# Patient Record
Sex: Male | Born: 1955 | Race: White | Hispanic: No | Marital: Married | Smoking: Former smoker
Health system: Southern US, Community
[De-identification: ages and names within clinical notes are randomized; demographics above are authoritative.]

## PROBLEM LIST (undated history)

## (undated) DIAGNOSIS — I10 Essential (primary) hypertension: Secondary | ICD-10-CM

## (undated) DIAGNOSIS — E119 Type 2 diabetes mellitus without complications: Secondary | ICD-10-CM

## (undated) HISTORY — PX: UVULOPALATOPHARYNGOPLASTY: SHX827

---

## 2010-03-18 DEATH — deceased

## 2011-09-09 ENCOUNTER — Inpatient Hospital Stay: Payer: Self-pay | Admitting: Specialist

## 2011-09-09 LAB — CBC WITH DIFFERENTIAL/PLATELET
Basophil #: 0.1 10*3/uL (ref 0.0–0.1)
Basophil %: 0.7 %
Eosinophil %: 0.1 %
HCT: 39.6 % — ABNORMAL LOW (ref 40.0–52.0)
HGB: 13.7 g/dL (ref 13.0–18.0)
Lymphocyte #: 0.8 10*3/uL — ABNORMAL LOW (ref 1.0–3.6)
MCH: 31.7 pg (ref 26.0–34.0)
MCV: 91 fL (ref 80–100)
Monocyte %: 7.4 %
Neutrophil #: 8.1 10*3/uL — ABNORMAL HIGH (ref 1.4–6.5)
RBC: 4.34 10*6/uL — ABNORMAL LOW (ref 4.40–5.90)
RDW: 13 % (ref 11.5–14.5)

## 2011-09-09 LAB — COMPREHENSIVE METABOLIC PANEL
Albumin: 3.8 g/dL (ref 3.4–5.0)
Anion Gap: 9 (ref 7–16)
BUN: 15 mg/dL (ref 7–18)
Calcium, Total: 8.5 mg/dL (ref 8.5–10.1)
Chloride: 102 mmol/L (ref 98–107)
Creatinine: 1.04 mg/dL (ref 0.60–1.30)
EGFR (African American): >60
Glucose: 173 mg/dL — ABNORMAL HIGH (ref 65–99)
Potassium: 3.7 mmol/L (ref 3.5–5.1)
SGOT(AST): 21 U/L (ref 15–37)
SGPT (ALT): 27 U/L

## 2011-09-10 LAB — CBC WITH DIFFERENTIAL/PLATELET
Basophil #: 0 10*3/uL (ref 0.0–0.1)
Basophil %: 0.4 %
Eosinophil #: 0 10*3/uL (ref 0.0–0.7)
HCT: 39.4 % — ABNORMAL LOW (ref 40.0–52.0)
HGB: 13.3 g/dL (ref 13.0–18.0)
Lymphocyte #: 0.6 10*3/uL — ABNORMAL LOW (ref 1.0–3.6)
MCH: 31.3 pg (ref 26.0–34.0)
MCHC: 33.7 g/dL (ref 32.0–36.0)
MCV: 93 fL (ref 80–100)
Monocyte #: 0.1 10*3/uL (ref 0.0–0.7)
Monocyte %: 1.3 %
Neutrophil #: 7.5 10*3/uL — ABNORMAL HIGH (ref 1.4–6.5)
Neutrophil %: 90.8 %
RDW: 12.8 % (ref 11.5–14.5)

## 2011-09-10 LAB — BASIC METABOLIC PANEL
Anion Gap: 12 (ref 7–16)
BUN: 17 mg/dL (ref 7–18)
Calcium, Total: 8.3 mg/dL — ABNORMAL LOW (ref 8.5–10.1)
Chloride: 103 mmol/L (ref 98–107)
Creatinine: 1.17 mg/dL (ref 0.60–1.30)
EGFR (African American): >60
Glucose: 299 mg/dL — ABNORMAL HIGH (ref 65–99)
Osmolality: 290 (ref 275–301)
Potassium: 4 mmol/L (ref 3.5–5.1)

## 2011-09-10 LAB — CK TOTAL AND CKMB (NOT AT ARMC)
CK, Total: 166 U/L (ref 35–232)
CK, Total: 191 U/L (ref 35–232)

## 2011-09-10 LAB — TROPONIN I: Troponin-I: <0.02 ng/mL

## 2012-02-15 ENCOUNTER — Emergency Department: Payer: Self-pay | Admitting: Emergency Medicine

## 2012-02-15 LAB — BASIC METABOLIC PANEL
Anion Gap: 7 (ref 7–16)
BUN: 16 mg/dL (ref 7–18)
Calcium, Total: 8.6 mg/dL (ref 8.5–10.1)
Creatinine: 1.12 mg/dL (ref 0.60–1.30)
EGFR (African American): >60
Glucose: 249 mg/dL — ABNORMAL HIGH (ref 65–99)
Sodium: 139 mmol/L (ref 136–145)

## 2012-02-15 LAB — CBC WITH DIFFERENTIAL/PLATELET
Eosinophil #: 0.1 10*3/uL (ref 0.0–0.7)
Eosinophil %: 1.1 %
HCT: 39.6 % — ABNORMAL LOW (ref 40.0–52.0)
HGB: 13.5 g/dL (ref 13.0–18.0)
Lymphocyte %: 13.7 %
MCHC: 34.1 g/dL (ref 32.0–36.0)
Monocyte #: 0.6 x10 3/mm (ref 0.2–1.0)
Monocyte %: 5.3 %
Neutrophil #: 9.7 10*3/uL — ABNORMAL HIGH (ref 1.4–6.5)
Neutrophil %: 79.4 %
Platelet: 224 10*3/uL (ref 150–440)
RBC: 4.34 10*6/uL — ABNORMAL LOW (ref 4.40–5.90)
RDW: 12.8 % (ref 11.5–14.5)

## 2012-02-15 LAB — URINALYSIS, COMPLETE
Glucose,UR: >=500 mg/dL (ref 0–75)
Ketone: NEGATIVE
Leukocyte Esterase: NEGATIVE
Ph: 5 (ref 4.5–8.0)
Specific Gravity: 1.021 (ref 1.003–1.030)
WBC UR: 1 /HPF (ref 0–5)

## 2012-02-16 ENCOUNTER — Emergency Department: Payer: Self-pay | Admitting: Emergency Medicine

## 2012-02-16 LAB — URINALYSIS, COMPLETE
Bilirubin,UR: NEGATIVE
Glucose,UR: NEGATIVE mg/dL (ref 0–75)
Ketone: NEGATIVE
Leukocyte Esterase: NEGATIVE
Nitrite: NEGATIVE
Ph: 7 (ref 4.5–8.0)
RBC,UR: <1 /HPF (ref 0–5)
Specific Gravity: 1.008 (ref 1.003–1.030)
Squamous Epithelial: NONE SEEN
WBC UR: <1 /HPF (ref 0–5)

## 2013-03-22 ENCOUNTER — Emergency Department: Payer: Self-pay | Admitting: Emergency Medicine

## 2014-12-10 NOTE — H&P (Signed)
PATIENT NAME:  Joe Hart, MATSUO MR#:  [id number] DATE OF BIRTH:  29-Aug-1955  DATE OF ADMISSION:  09/10/2011  REFERRING PHYSICIAN: Dr. Payton Doughty    PRIMARY CARE PHYSICIAN: Dr. Lennox Grumbles    PRESENTING COMPLAINT: Fevers, chills, nausea, nonproductive cough, myalgias, shortness of breath worsening with cough.   HISTORY OF PRESENT ILLNESS: Mr. Albanese is a 59 year old gentleman with history of diabetes, hypertension, hyperlipidemia, ongoing tobacco abuse, obstructive sleep apnea status post UPPP presenting from [facility] He reports his symptoms began last night and worsening today. He was seen at [facility] with a temperature of 102.4, sating at 89% on room air. He had a rapid strep done that was negative and Influenza test done that was positive. The patient received Tylenol and Phenergan and breathing treatments and remained 91% on room air after albuterol nebulizer. He was sent over here for further evaluation and given a Tamiflu prescription as well as Cheratussin AC. The patient reports also chest pain only with cough. He denies any sick contact. Denies orthopnea or PND. He endorses wheezing and some dizziness. No syncope   PAST MEDICAL HISTORY:  1. Diabetes.  2. Hypertension.  3. Hyperlipidemia.  4. Tobacco abuse.  5. Obesity.  6. Obstructive sleep apnea, status post UPPP.   PAST SURGICAL HISTORY: As above and also umbilical hernia repair.   ALLERGIES: No known drug allergies.   MEDICATIONS:  1. Lipitor 20 mg daily.  2. Glipizide 10 mg b.i.d.  3. Metformin 1000 mg b.i.d.  4. Norvasc 10 mg daily.  5. Hydrochlorothiazide 25 mg daily.  6. Accupril 60 mg daily.  7. Fluoxetine 20 mg daily.  8. Viagra 100 mg daily.  9. Hydralazine 50 mg b.i.d.  10. Aspirin 81 mg daily.   FAMILY HISTORY: Mother with liver cancer. Great aunt with pancreatic cancer. Father's side unknown.   SOCIAL HISTORY: He lives in Home with his wife. He smokes 2 packs per day. No alcohol or drug use. He is a  Dealer.  REVIEW OF SYSTEMS: CONSTITUTIONAL: Endorses fevers, chills, nausea. EYES: No glaucoma or cataracts. ENT: He has sore throat and dizziness. RESPIRATORY: Endorses cough and wheezing. No hemoptysis. Endorses shortness of breath worsening with cough and on exertion. CARDIOVASCULAR: Chest pain with cough only. No orthopnea, edema, palpitations, or syncope. GI: He endorses nausea but no vomiting, diarrhea, or abdominal pain. No hematemesis or melena. GU: No dysuria or hematuria. ENDOCRINE: No polyuria or polydipsia. HEME: No easy bleeding. SKIN: No ulcers. MUSCULOSKELETAL: He has myalgias. NEUROLOGIC: No one-sided weakness or numbness. PSYCH: Denies any suicidal ideation.   PHYSICAL EXAMINATION:   VITAL SIGNS: 102.4 over at the clinic, temperature 100.2 but he did receive Tylenol in the clinic, heart rate 109, respiratory rate 22, blood pressure 155/90, sating at 93% on room air.   GENERAL: Lying in bed in no apparent distress. He is weak appearing.   HEENT: Normocephalic, atraumatic. Pupils equal, symmetric, nonicteric. Nares boggy. Moist mucous membrane.   NECK: Soft and supple with no adenopathy or JVP.   CARDIOVASCULAR: Slightly tachy. No murmurs, rubs, or gallops.   LUNGS: Basilar crackles. No use of accessory muscles or increased respiratory effort.   ABDOMEN: Soft. Positive bowel sounds. No mass appreciated.   EXTREMITIES: No edema. Dorsal pedis pulses intact.   MUSCULOSKELETAL: No joint effusion.   SKIN: No ulcers.   NEUROLOGIC: No dysarthria or aphasia. Symmetrical strength. No focal deficits.   PSYCH: He is alert and oriented. The patient is cooperative.   PERTINENT LABS AND STUDIES: WBC  9.7, hemoglobin 13.7, hematocrit 39.6, platelets 175, MCV 91, glucose 173, BUN 15, creatinine 1.04, sodium 139, potassium 3.7, chloride 102, carbon dioxide 28, calcium 8.5, total bilirubin 0.4, alkaline phosphatase 54, ALT 27, AST 21, total protein 7.6. EKG sinus rate of 96. No ST  elevation or depression. Chest x-ray with increased interstitial markings and vascular prominence. No infiltrate identified. Official chest x-ray reading is pending.   ASSESSMENT AND PLAN: Mr. Spreen is a 59 year old gentleman with history of hypertension, diabetes, hyperlipidemia, tobacco abuse, obstructive sleep apnea status post UPPP presenting from [facility] with positive Influenza and hypoxia.  1. Probable chronic obstructive pulmonary disease exacerbation with positive Influenza. Continue droplet isolation. Continue Tamiflu. Start Solu-Medrol, SVNs, oxygen, and Spiriva. Will obtain ambulatory pulse oximetry in the morning.   2. Ongoing tobacco use. Start on nicotine patch.  3. Hypertension. Restart hydrochlorothiazide, Norvasc, Accupril, hydralazine.  4. Diabetes. Metformin and glipizide.  5. Hyperlipidemia. Lipitor.  6. Prophylaxis with Protonix and Lovenox.   TIME SPENT: Approximately 45 minutes spent on patient care.  ____________________________ Rita Ohara, MD ap:drc D: 09/09/2011 23:22:30 ET T: 09/10/2011 06:25:34 ET JOB#: [id number] cc: Brien Few Martavius Lusty, MD, <Dictator>, Marguerita Merles, MD Rita Ohara MD ELECTRONICALLY SIGNED 10/14/2011 0:30

## 2014-12-10 NOTE — Discharge Summary (Signed)
PATIENT NAME:  Joe Hart, Joe Hart MR#:  [id number] DATE OF BIRTH:  22-Jun-1956  DATE OF ADMISSION:  09/09/2011 DATE OF DISCHARGE:  09/10/2011  For a detailed note, please take a look at the history and physical done with Dr. Inez Catalina.   DIAGNOSES AT DISCHARGE:  1. Chronic obstructive pulmonary disease exacerbation secondary to influenza.  2. Influenza.  3. Diabetes.  4. Hypertension.  5. Obstructive sleep apnea. 6. Depression.   DIET: Patient is being discharged on a low-sodium, American Diabetic Association low-fat diet.   ACTIVITY: As tolerated.   FOLLOW UP: Follow up with Dr. Delight Stare in the next 1 to 2 days.   DISCHARGE MEDICATIONS:  1. Lipitor 20 mg at bedtime.  2. Glipizide 5 mg 2 tabs b.i.d.  3. Metformin 500 mg 2 tabs b.i.d.  4. Norvasc 10 mg daily.  5. Hydrochlorothiazide 25 mg daily.  6. Accupril 20 mg 3 tabs daily.  7. Fluoxetine 20 mg daily.  8. Viagra as needed. 9. Hydralazine 25 mg 2 tabs b.i.d.  10. Aspirin 81 mg daily.  11. Tamiflu 75 mg b.i.d. x4 days.   LABORATORY, DIAGNOSTIC AND RADIOLOGICAL DATA: Pertinent studies done during the hospital course are as follows: Chest x-ray done on 01/22 showing interstitial markings increased bilaterally which may reflect interstitial edema of noncardiac cause such as early interstitial pneumonia. No alveolar pneumonia noted.   HOSPITAL COURSE: This is a 59 year old male with medical problems as mentioned above presented to the hospital secondary to shortness of breath, nonproductive cough and noted to be influenza positive.  1. Chronic obstructive pulmonary disease exacerbation, likely secondary to influenza. Patient was started on Tamiflu, maintained on some IV steroids, around-the-clock nebulizer treatments. Patient overnight has significantly improved. He currently has no bronchospasm. He was ambulated on room air and did not desaturate below 95. He currently has no wheezing and the shortness of breath has dramatically  improved. He is currently being discharged home with close follow up with his primary care physician. He will finish his Tamiflu for his influenza. He really is not bronchospastic and therefore was not prescribed any steroids upon discharge.  2. Hypertension. Patient was maintained on his hydrochlorothiazide, Norvasc, Accupril and hydralazine. He will resume that upon discharge.  3. Diabetes. His sugars are running a little bit high as he was on some IV Solu-Medrol. Since he is not going to be on any steroids he will resume his metformin and glipizide upon discharge.  4. Hyperlipidemia. Patient was maintained on his Lipitor, he will resume that.  5. Depression. Patient was maintained on his Prozac and he will resume that upon discharge too.  6. CODE STATUS: Patient is a FULL CODE.   TIME SPENT: 35 minutes.  ____________________________ Belia Heman. Verdell Carmine, MD vjs:cms D: 09/10/2011 16:03:02 ET T: 09/11/2011 11:54:37 ET JOB#: [id number]  cc: Belia Heman. Verdell Carmine, MD, <Dictator> Marguerita Merles, MD Henreitta Leber MD ELECTRONICALLY SIGNED 09/11/2011 15:42

## 2016-04-04 ENCOUNTER — Other Ambulatory Visit: Payer: Self-pay | Admitting: Otolaryngology

## 2016-04-04 DIAGNOSIS — R221 Localized swelling, mass and lump, neck: Secondary | ICD-10-CM

## 2016-04-11 ENCOUNTER — Ambulatory Visit
Admission: RE | Admit: 2016-04-11 | Discharge: 2016-04-11 | Disposition: A | Discharge status: Home / Self Care | Payer: 59 | Source: Ambulatory Visit | Attending: Otolaryngology | Admitting: Otolaryngology

## 2016-04-11 DIAGNOSIS — I700 Atherosclerosis of aorta: Secondary | ICD-10-CM | POA: Insufficient documentation

## 2016-04-11 DIAGNOSIS — R221 Localized swelling, mass and lump, neck: Secondary | ICD-10-CM | POA: Insufficient documentation

## 2016-04-11 HISTORY — DX: Type 2 diabetes mellitus without complications: E11.9

## 2016-04-11 HISTORY — DX: Essential (primary) hypertension: I10

## 2016-04-11 LAB — POCT I-STAT CREATININE: CREATININE: 1.1 mg/dL (ref 0.61–1.24)

## 2016-04-11 MED ORDER — IOPAMIDOL (ISOVUE-300) INJECTION 61%
75.0000 mL | Freq: Once | INTRAVENOUS | Status: AC | PRN
  Administered 2016-04-11: 75 mL via INTRAVENOUS

## 2016-04-17 NOTE — Discharge Instructions (Signed)

## 2016-04-22 ENCOUNTER — Ambulatory Visit
Admission: RE | Admit: 2016-04-22 | Discharge: 2016-04-22 | Disposition: A | Discharge status: Home / Self Care | Payer: 59 | Source: Ambulatory Visit | Attending: Otolaryngology | Admitting: Otolaryngology

## 2016-04-22 ENCOUNTER — Ambulatory Visit: Payer: 59 | Admitting: Student in an Organized Health Care Education/Training Program

## 2016-04-22 ENCOUNTER — Encounter
Admission: RE | Disposition: A | Discharge status: Home / Self Care | Payer: Self-pay | Source: Ambulatory Visit | Attending: Otolaryngology

## 2016-04-22 DIAGNOSIS — E7800 Pure hypercholesterolemia, unspecified: Secondary | ICD-10-CM | POA: Insufficient documentation

## 2016-04-22 DIAGNOSIS — E119 Type 2 diabetes mellitus without complications: Secondary | ICD-10-CM | POA: Insufficient documentation

## 2016-04-22 DIAGNOSIS — Z79899 Other long term (current) drug therapy: Secondary | ICD-10-CM | POA: Insufficient documentation

## 2016-04-22 DIAGNOSIS — Z87891 Personal history of nicotine dependence: Secondary | ICD-10-CM | POA: Insufficient documentation

## 2016-04-22 DIAGNOSIS — D179 Benign lipomatous neoplasm, unspecified: Secondary | ICD-10-CM | POA: Diagnosis present

## 2016-04-22 DIAGNOSIS — I10 Essential (primary) hypertension: Secondary | ICD-10-CM | POA: Diagnosis not present

## 2016-04-22 DIAGNOSIS — D1779 Benign lipomatous neoplasm of other sites: Secondary | ICD-10-CM | POA: Diagnosis not present

## 2016-04-22 HISTORY — PX: LIPOMA EXCISION: SHX5283

## 2016-04-22 LAB — GLUCOSE, CAPILLARY
GLUCOSE-CAPILLARY: 152 mg/dL — AB (ref 65–99)
Glucose-Capillary: 120 mg/dL — ABNORMAL HIGH (ref 65–99)

## 2016-04-22 SURGERY — EXCISION LIPOMA
Anesthesia: General | Laterality: Right | Wound class: Clean

## 2016-04-22 MED ORDER — ACETAMINOPHEN 160 MG/5ML PO SOLN: 325.0000 mg | ORAL | Status: DC | PRN

## 2016-04-22 MED ORDER — GLYCOPYRROLATE 0.2 MG/ML IJ SOLN
INTRAMUSCULAR | Status: DC | PRN
  Administered 2016-04-22: 0.1 mg via INTRAVENOUS

## 2016-04-22 MED ORDER — HYDROCODONE-ACETAMINOPHEN 5-325 MG PO TABS: 1.0000 | ORAL_TABLET | Freq: Four times a day (QID) | ORAL | 0 refills | Status: AC | PRN

## 2016-04-22 MED ORDER — ACETAMINOPHEN 325 MG PO TABS: 325.0000 mg | ORAL_TABLET | ORAL | Status: DC | PRN

## 2016-04-22 MED ORDER — LACTATED RINGERS IV SOLN
INTRAVENOUS | Status: DC
  Administered 2016-04-22: 07:00:00 via INTRAVENOUS

## 2016-04-22 MED ORDER — EPHEDRINE SULFATE 50 MG/ML IJ SOLN
INTRAMUSCULAR | Status: DC | PRN
  Administered 2016-04-22 (×4): 5 mg via INTRAVENOUS
  Administered 2016-04-22: 10 mg via INTRAVENOUS

## 2016-04-22 MED ORDER — LIDOCAINE HCL (CARDIAC) 20 MG/ML IV SOLN
INTRAVENOUS | Status: DC | PRN
  Administered 2016-04-22: 50 mg via INTRATRACHEAL

## 2016-04-22 MED ORDER — PROPOFOL 10 MG/ML IV BOLUS
INTRAVENOUS | Status: DC | PRN
  Administered 2016-04-22: 150 mg via INTRAVENOUS
  Administered 2016-04-22: 50 mg via INTRAVENOUS

## 2016-04-22 MED ORDER — OXYCODONE HCL 5 MG/5ML PO SOLN: 5.0000 mg | Freq: Once | ORAL | Status: DC | PRN

## 2016-04-22 MED ORDER — MIDAZOLAM HCL 5 MG/5ML IJ SOLN
INTRAMUSCULAR | Status: DC | PRN
  Administered 2016-04-22: 2 mg via INTRAVENOUS

## 2016-04-22 MED ORDER — BACITRACIN 500 UNIT/GM EX OINT
TOPICAL_OINTMENT | CUTANEOUS | Status: DC | PRN
  Administered 2016-04-22: 1 via TOPICAL

## 2016-04-22 MED ORDER — LIDOCAINE-EPINEPHRINE 1 %-1:100000 IJ SOLN
INTRAMUSCULAR | Status: DC | PRN
  Administered 2016-04-22: 5 mL

## 2016-04-22 MED ORDER — FENTANYL CITRATE (PF) 100 MCG/2ML IJ SOLN
INTRAMUSCULAR | Status: DC | PRN
  Administered 2016-04-22 (×2): 50 ug via INTRAVENOUS

## 2016-04-22 MED ORDER — HYDROMORPHONE HCL 1 MG/ML IJ SOLN: 0.2500 mg | INTRAMUSCULAR | Status: DC | PRN

## 2016-04-22 MED ORDER — DEXAMETHASONE SODIUM PHOSPHATE 4 MG/ML IJ SOLN
INTRAMUSCULAR | Status: DC | PRN
  Administered 2016-04-22: 10 mg via INTRAVENOUS

## 2016-04-22 MED ORDER — ONDANSETRON HCL 4 MG/2ML IJ SOLN
4.0000 mg | Freq: Once | INTRAMUSCULAR | Status: AC | PRN
  Administered 2016-04-22: 4 mg via INTRAVENOUS

## 2016-04-22 MED ORDER — OXYCODONE HCL 5 MG PO TABS: 5.0000 mg | ORAL_TABLET | Freq: Once | ORAL | Status: DC | PRN

## 2016-04-22 SURGICAL SUPPLY — 34 items
APPLICATOR COTTON TIP WD 3 STR (MISCELLANEOUS) IMPLANT
BLADE SURG 15 STRL LF DISP TIS (BLADE) IMPLANT
BLADE SURG 15 STRL SS (BLADE)
CANISTER SUCT 1200ML W/VALVE (MISCELLANEOUS) ×3 IMPLANT
CORD BIP STRL DISP 12FT (MISCELLANEOUS) ×3 IMPLANT
DRAPE HEAD BAR (DRAPES) IMPLANT
DRESSING TELFA 4X3 1S ST N-ADH (GAUZE/BANDAGES/DRESSINGS) ×6 IMPLANT
DRSG TEGADERM 4X4.75 (GAUZE/BANDAGES/DRESSINGS) ×3 IMPLANT
ELECT CAUTERY BLADE TIP 2.5 (TIP)
ELECT CAUTERY NEEDLE 2.0 MIC (NEEDLE) IMPLANT
ELECTRODE CAUTERY BLDE TIP 2.5 (TIP) IMPLANT
GAUZE SPONGE 4X4 12PLY STRL (GAUZE/BANDAGES/DRESSINGS) IMPLANT
GLOVE BIO SURGEON STRL SZ7.5 (GLOVE) ×6 IMPLANT
KIT ROOM TURNOVER OR (KITS) ×3 IMPLANT
LIQUID BAND (GAUZE/BANDAGES/DRESSINGS) ×3 IMPLANT
NEEDLE HYPO 25GX1X1/2 BEV (NEEDLE) ×3 IMPLANT
NS IRRIG 500ML POUR BTL (IV SOLUTION) ×3 IMPLANT
PACK DRAPE NASAL/ENT (PACKS) ×3 IMPLANT
PAD GROUND ADULT SPLIT (MISCELLANEOUS) IMPLANT
PENCIL ELECTRO HAND CTR (MISCELLANEOUS) IMPLANT
SOL PREP PVP 2OZ (MISCELLANEOUS) ×3
SOLUTION PREP PVP 2OZ (MISCELLANEOUS) ×1 IMPLANT
STRAP BODY AND KNEE 60X3 (MISCELLANEOUS) ×6 IMPLANT
SUCTION FRAZIER TIP 10 FR DISP (SUCTIONS) IMPLANT
SUT PLAIN GUT FAST 5-0 (SUTURE) IMPLANT
SUT PROLENE 4 0 PS 2 18 (SUTURE) ×3 IMPLANT
SUT PROLENE 5 0 P 3 (SUTURE) IMPLANT
SUT VIC AB 4-0 RB1 27 (SUTURE)
SUT VIC AB 4-0 RB1 27X BRD (SUTURE) IMPLANT
SUT VIC AB 5-0 P-3 18X BRD (SUTURE) IMPLANT
SUT VIC AB 5-0 P3 18 (SUTURE)
SUT VICRYL AB 3-0 FS1 BRD 27IN (SUTURE) ×3 IMPLANT
SYRINGE 10CC LL (SYRINGE) ×3 IMPLANT
TOWEL OR 17X26 4PK STRL BLUE (TOWEL DISPOSABLE) IMPLANT

## 2016-04-22 NOTE — Transfer of Care (Signed)
Immediate Anesthesia Transfer of Care Note  Patient: Joe Hart  Procedure(s) Performed: Procedure(s): EXCISION LIPOMA NECK (Right)  Patient Location: PACU  Anesthesia Type: General LMA  Level of Consciousness: awake, alert  and patient cooperative  Airway and Oxygen Therapy: Patient Spontanous Breathing and Patient connected to supplemental oxygen  Post-op Assessment: Post-op Vital signs reviewed, Patient's Cardiovascular Status Stable, Respiratory Function Stable, Patent Airway and No signs of Nausea or vomiting  Post-op Vital Signs: Reviewed and stable  Complications: No apparent anesthesia complications

## 2016-04-22 NOTE — Anesthesia Preprocedure Evaluation (Signed)
Anesthesia Evaluation  Patient identified by MRN, date of birth, ID band Patient awake    Reviewed: Allergy & Precautions, H&P , NPO status , Patient's Chart, lab work & pertinent test results, reviewed documented beta blocker date and time   Airway Mallampati: I  TM Distance: >3 FB Neck ROM: full    Dental  (+) Upper Dentures, Lower Dentures   Pulmonary neg pulmonary ROS, former smoker,    Pulmonary exam normal breath sounds clear to auscultation       Cardiovascular Exercise Tolerance: Good hypertension, On Medications Normal cardiovascular exam Rhythm:regular Rate:Normal     Neuro/Psych negative neurological ROS  negative psych ROS   GI/Hepatic negative GI ROS, Neg liver ROS,   Endo/Other  negative endocrine ROSdiabetes, Well Controlled, Type 2, Oral Hypoglycemic Agents  Renal/GU negative Renal ROS  negative genitourinary   Musculoskeletal   Abdominal   Peds  Hematology negative hematology ROS (+)   Anesthesia Other Findings   Reproductive/Obstetrics negative OB ROS                             Anesthesia Physical Anesthesia Plan  ASA: II  Anesthesia Plan: General LMA   Post-op Pain Management:    Induction:   Airway Management Planned:   Additional Equipment:   Intra-op Plan:   Post-operative Plan:   Informed Consent: I have reviewed the patients History and Physical, chart, labs and discussed the procedure including the risks, benefits and alternatives for the proposed anesthesia with the patient or authorized representative who has indicated his/her understanding and acceptance.   Dental Advisory Given  Plan Discussed with: CRNA  Anesthesia Plan Comments:         Anesthesia Quick Evaluation

## 2016-04-22 NOTE — Anesthesia Procedure Notes (Signed)
Procedure Name: LMA Insertion Date/Time: 04/22/2016 8:16 AM Performed by: Mayme Genta Pre-anesthesia Checklist: Patient identified, Emergency Drugs available, Suction available, Timeout performed and Patient being monitored Patient Re-evaluated:Patient Re-evaluated prior to inductionOxygen Delivery Method: Circle system utilized Preoxygenation: Pre-oxygenation with 100% oxygen Intubation Type: IV induction LMA: LMA inserted LMA Size: 4.0 Number of attempts: 1 Placement Confirmation: positive ETCO2 and breath sounds checked- equal and bilateral Tube secured with: Tape

## 2016-04-22 NOTE — Op Note (Signed)
04/22/2016  9:22 AM    Joe Hart  SL:[id number]   Pre-Op Diagnosis:  LIPOMA  Post-op Diagnosis: LIPOMA, right posterior neck.  Procedure:   Excision right posterior neck lipoma  Surgeon:  Clyde Canterbury S  Anesthesia:  General with LMA  EBL:  Less than [id number]  Complications:  None  Findings: 2.5 cm right posterior neck lipoma  Procedure: After the patient was identified in holding and the procedure was reviewed.  The patient was taken to the operating room and with the patient in a comfortable supine position,  general  anesthesia was induced without difficulty, with laryngeal mask airway support.  He was then moved to a left lateral position to facilitate access to the lipoma, which was on the right posterior neck, just above the back. He was carefully padded with pillows and a beanbag support. A proper time-out was performed, confirming the operative site and procedure.  1% lidocaine with epinephrine, 1:100:000 was injected into the site of the lipoma. The neck was then prepped and draped in the usual sterile fashion.  The skin over the lipoma was incised with a 15 blade and the incision carried down to the subcutaneous fatty tissues. There was a thin layer of fascia overlying the lipoma which was divided with scissors. The lipoma was then carefully dissected out, grasped using Allis forceps, and excised utilizing scissors, utilizing the bipolar cautery to divide and cauterize small blood vessels in the area. No major neurovascular structures were encountered during the dissection. The lipoma had an indistinct capsule, but the full extent of the palpable mass was completely excised. The wound was irrigated with saline and minor bleeding controlled with the bipolar cautery. The wound was then closed in layers. 3-0 Vicryl suture was used to close the deeper portion of the wound, reapproximating the divided fascia, and closing the dead space just under this. 3-0 Vicryl was used for the  subcutaneous closure followed by 4-0 Prolene suture in a running locked stitch for the skin. Bacitracin ointment was applied followed by a Telfa and Tegaderm dressing.  [name] and taken to the recovery room in good condition.   Disposition:   PACU then discharge home  Plan: May remove dressing tomorrow. Polysporin to wound twice daily and cleaned with peroxide as needed. Take pain medications as prescribed. Follow-up next Friday for suture removal.  Joe Hart 04/22/2016 9:22 AM

## 2016-04-22 NOTE — Anesthesia Postprocedure Evaluation (Signed)
Anesthesia Post Note  Patient: Joe Hart  Procedure(s) Performed: Procedure(s) (LRB): EXCISION LIPOMA NECK (Right)  Patient location during evaluation: PACU Anesthesia Type: General Level of consciousness: awake and alert Pain management: pain level controlled Vital Signs Assessment: post-procedure vital signs reviewed and stable Respiratory status: spontaneous breathing, nonlabored ventilation and respiratory function stable Cardiovascular status: blood pressure returned to baseline and stable Postop Assessment: no signs of nausea or vomiting Anesthetic complications: no    Trecia Rogers

## 2016-04-22 NOTE — H&P (Signed)
History and physical reviewed and will be scanned in later. No change in medical status reported by the patient or family, appears stable for surgery. All questions regarding the procedure answered, and patient (or family if a child) expressed understanding of the procedure.  Joe Hart @TODAY@ 

## 2016-04-23 ENCOUNTER — Encounter: Payer: Self-pay | Admitting: Otolaryngology

## 2016-04-24 LAB — SURGICAL PATHOLOGY

## 2017-10-22 ENCOUNTER — Ambulatory Visit
Admission: RE | Admit: 2017-10-22 | Discharge: 2017-10-22 | Disposition: A | Discharge status: Home / Self Care | Payer: 59 | Source: Ambulatory Visit | Attending: Family Medicine | Admitting: Family Medicine

## 2017-10-22 ENCOUNTER — Other Ambulatory Visit: Payer: Self-pay | Admitting: Family Medicine

## 2017-10-22 DIAGNOSIS — R05 Cough: Secondary | ICD-10-CM

## 2017-10-22 DIAGNOSIS — R059 Cough, unspecified: Secondary | ICD-10-CM

## 2017-10-22 DIAGNOSIS — R918 Other nonspecific abnormal finding of lung field: Secondary | ICD-10-CM | POA: Insufficient documentation

## 2018-01-31 IMAGING — CT CT NECK W/ CM
2 of 3 series · 8 of 14 positions shown, 9 images · IV contrast (iopamidol)
Comparison: None.

CLINICAL DATA: Right posterior neck swelling. Irritation for the
last 2-3 months. Marked with skin marker.

EXAM:
CT NECK WITH CONTRAST
TECHNIQUE: Multidetector CT imaging of the neck was performed using the
standard protocol following the bolus administration of intravenous
contrast.
CONTRAST:  75mL N5RXML-QZZ IOPAMIDOL (N5RXML-QZZ) INJECTION 61%

[Series 2: axial neck · axial · 0.62mm/px · z∈[-256,-84]mm · 4 of 144 slices shown]
[im 29/144  bone]
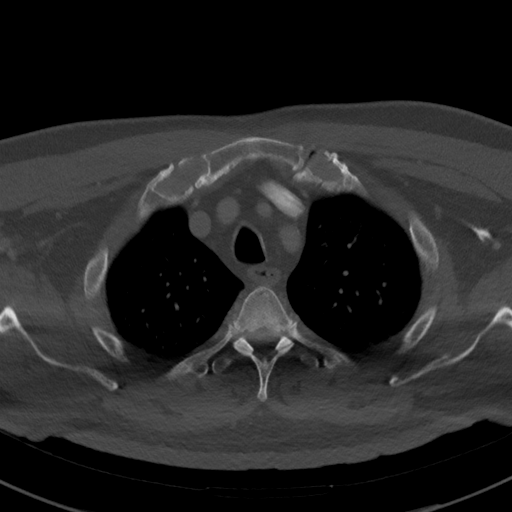
[im 58/144  bone]
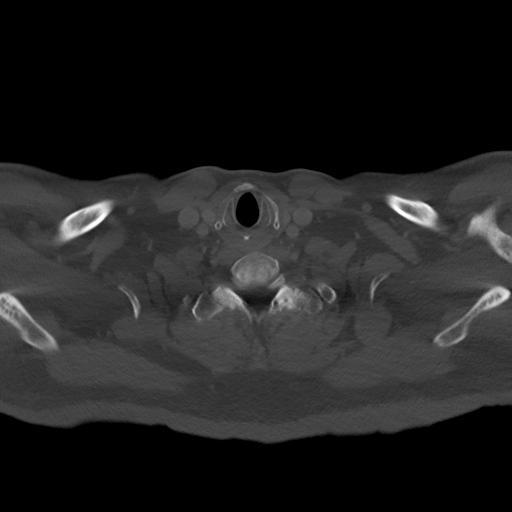
[im 86/144  bone]
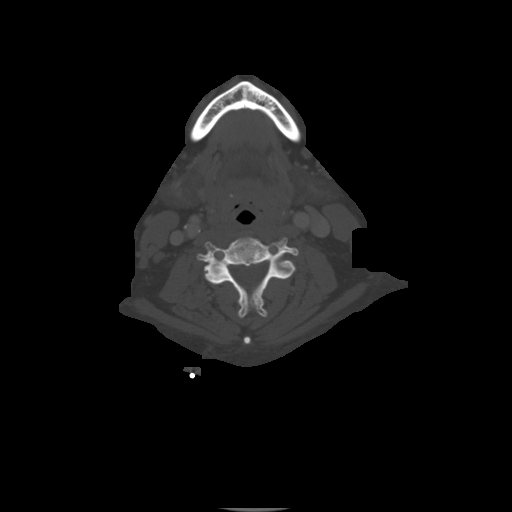
[im 115/144  bone]
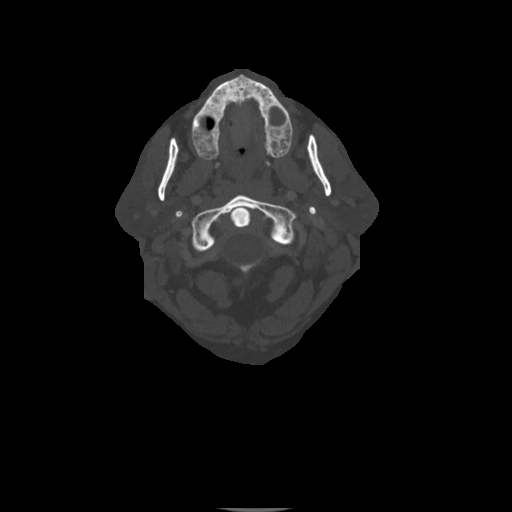

[Series 7: orthogonal ax · axial · 0.56mm/px · z∈[-295,-98]mm · 4 of 169 slices shown, 5 images]
[im 34/169  soft-tissue]
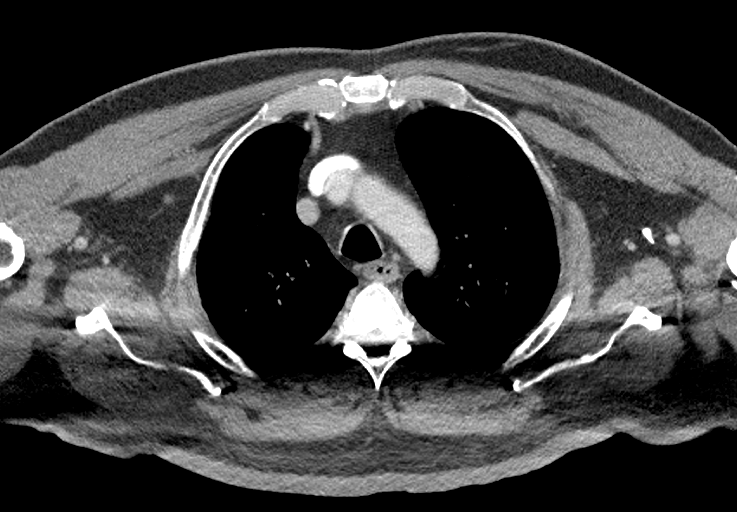
[im 34/169  bone]
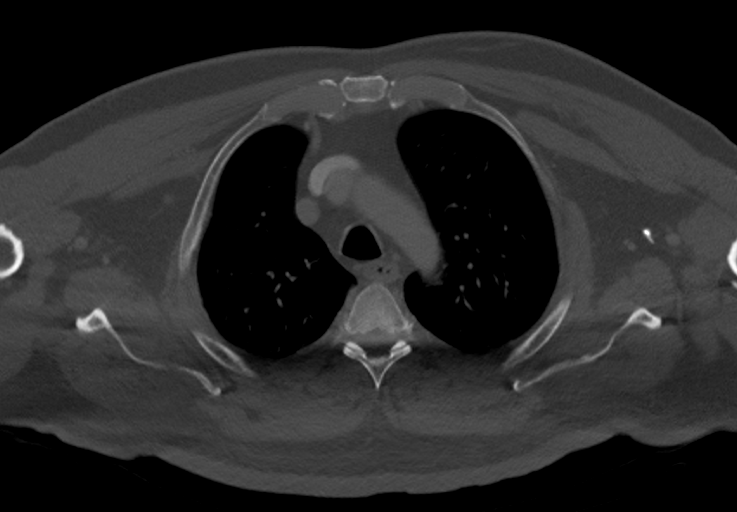
[im 68/169  bone]
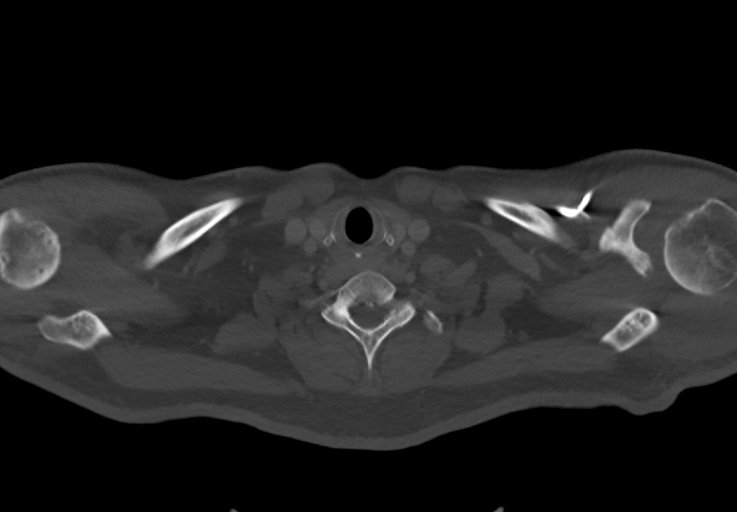
[im 101/169  bone]
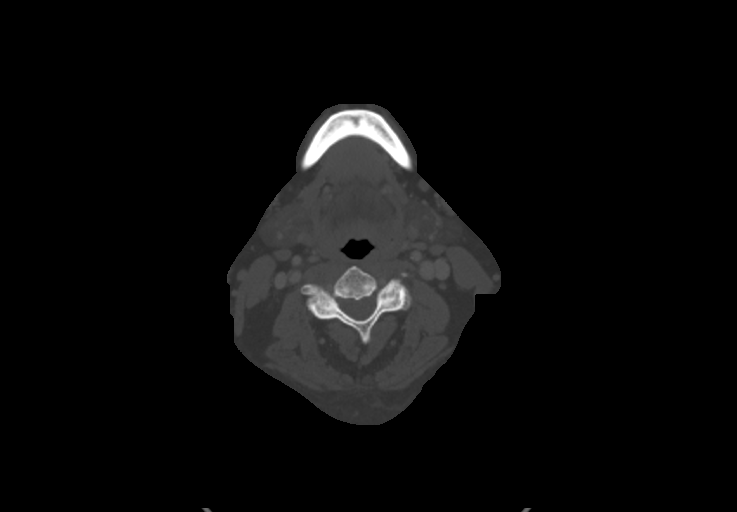
[im 135/169  bone]
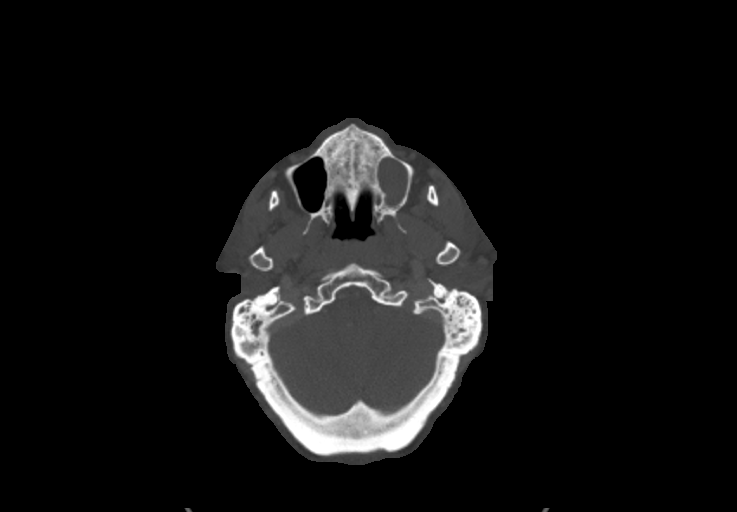

[8 of 14 positions shown; findings below may reference images not displayed]

FINDINGS: Pharynx and larynx: Normal.

Salivary glands: Parotid and submandibular glands are normal.

Thyroid: Normal.

Lymph nodes: No enlarged or low-density nodes on either side of the
neck. At the area of concern, there is a well-circumscribed lipoma
in the subcutaneous fat with a transverse diameter of 2.4 x 1.5 cm
in a cephalocaudal measurement of 2.5 cm. No complicating features
by CT.

Vascular: Normal.

Limited intracranial: Normal.

Visualized orbits: Normal.

Mastoids and visualized paranasal sinuses: Paranasal sinuses are
clear. There is fluid in the mastoid air cells bilaterally, more
extensive on the left than the right. Fluid in the left middle ear.

Skeleton: Degenerative spondylosis and facet arthropathy.

Upper chest: Normal except for aortic atherosclerosis.
IMPRESSION: The area of concern corresponds to a well-circumscribed ovoid lipoma
of the subcutaneous fat to the right of midline posterior to the C5
level measuring 2.4 x 1.5 x 2.5 cm. No unusual or complicated
features demonstrable by CT.

Fluid in the mastoids left more than right and in the left middle
ear.

Aortic atherosclerosis.

## 2018-08-04 DIAGNOSIS — E119 Type 2 diabetes mellitus without complications: Secondary | ICD-10-CM | POA: Insufficient documentation

## 2018-08-07 DIAGNOSIS — I10 Essential (primary) hypertension: Secondary | ICD-10-CM | POA: Insufficient documentation

## 2019-07-08 ENCOUNTER — Other Ambulatory Visit: Payer: Self-pay

## 2019-07-08 DIAGNOSIS — Z20822 Contact with and (suspected) exposure to covid-19: Secondary | ICD-10-CM

## 2019-07-11 LAB — NOVEL CORONAVIRUS, NAA: SARS-CoV-2, NAA: NOT DETECTED

## 2019-07-27 ENCOUNTER — Other Ambulatory Visit: Payer: Self-pay

## 2019-07-27 DIAGNOSIS — Z20822 Contact with and (suspected) exposure to covid-19: Secondary | ICD-10-CM

## 2019-07-28 LAB — NOVEL CORONAVIRUS, NAA: SARS-CoV-2, NAA: NOT DETECTED

## 2019-08-13 IMAGING — CR DG CHEST 2V
1 series · 2 of 2 positions shown · non-contrast
Comparison: 09/09/2011.

CLINICAL DATA: 61-year-old with two-day history of cough. Recent
diagnosis of influenza. Former smoker. Current history of
hypertension and diabetes.

EXAM:
CHEST - 2 VIEW

[Series 1: dg chest 2 view · 0.14mm/px · 2 of 2 slices shown]
[im 1/2]
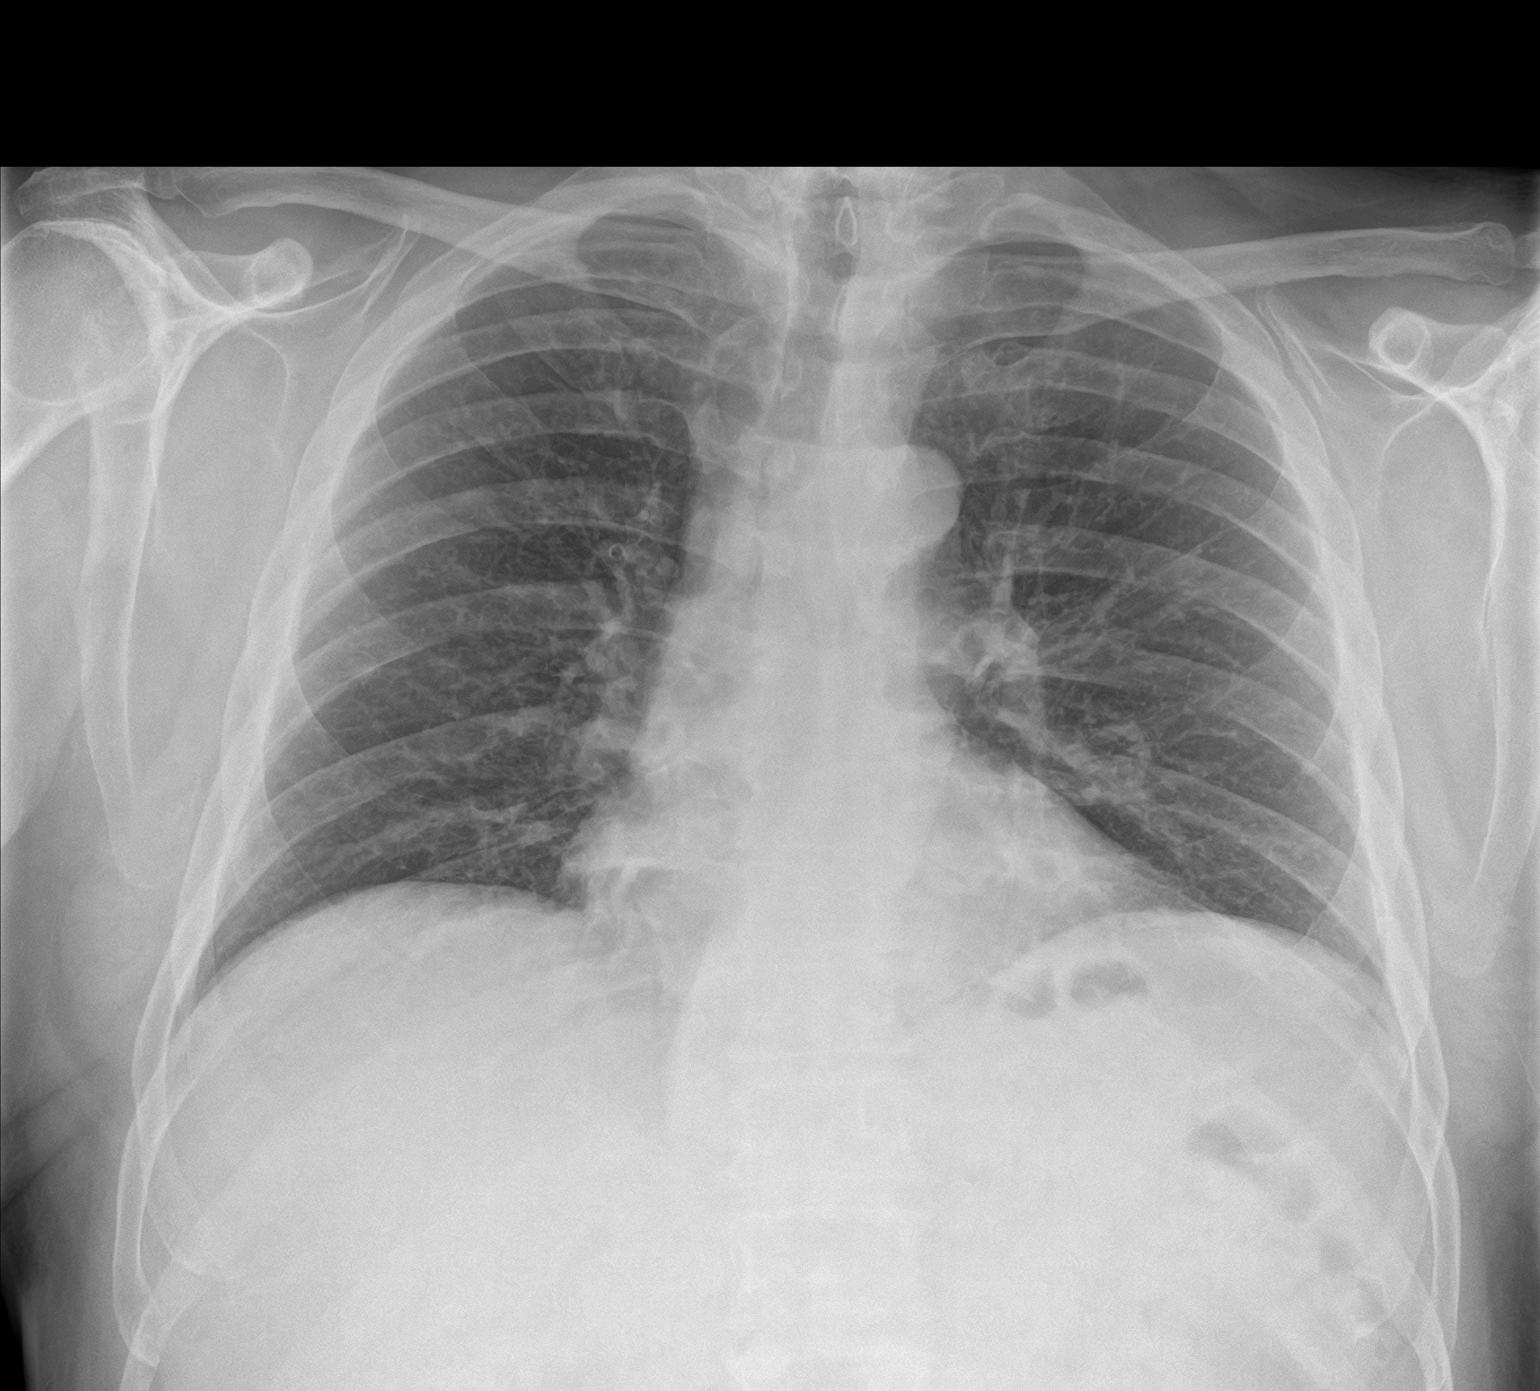
[im 2/2]
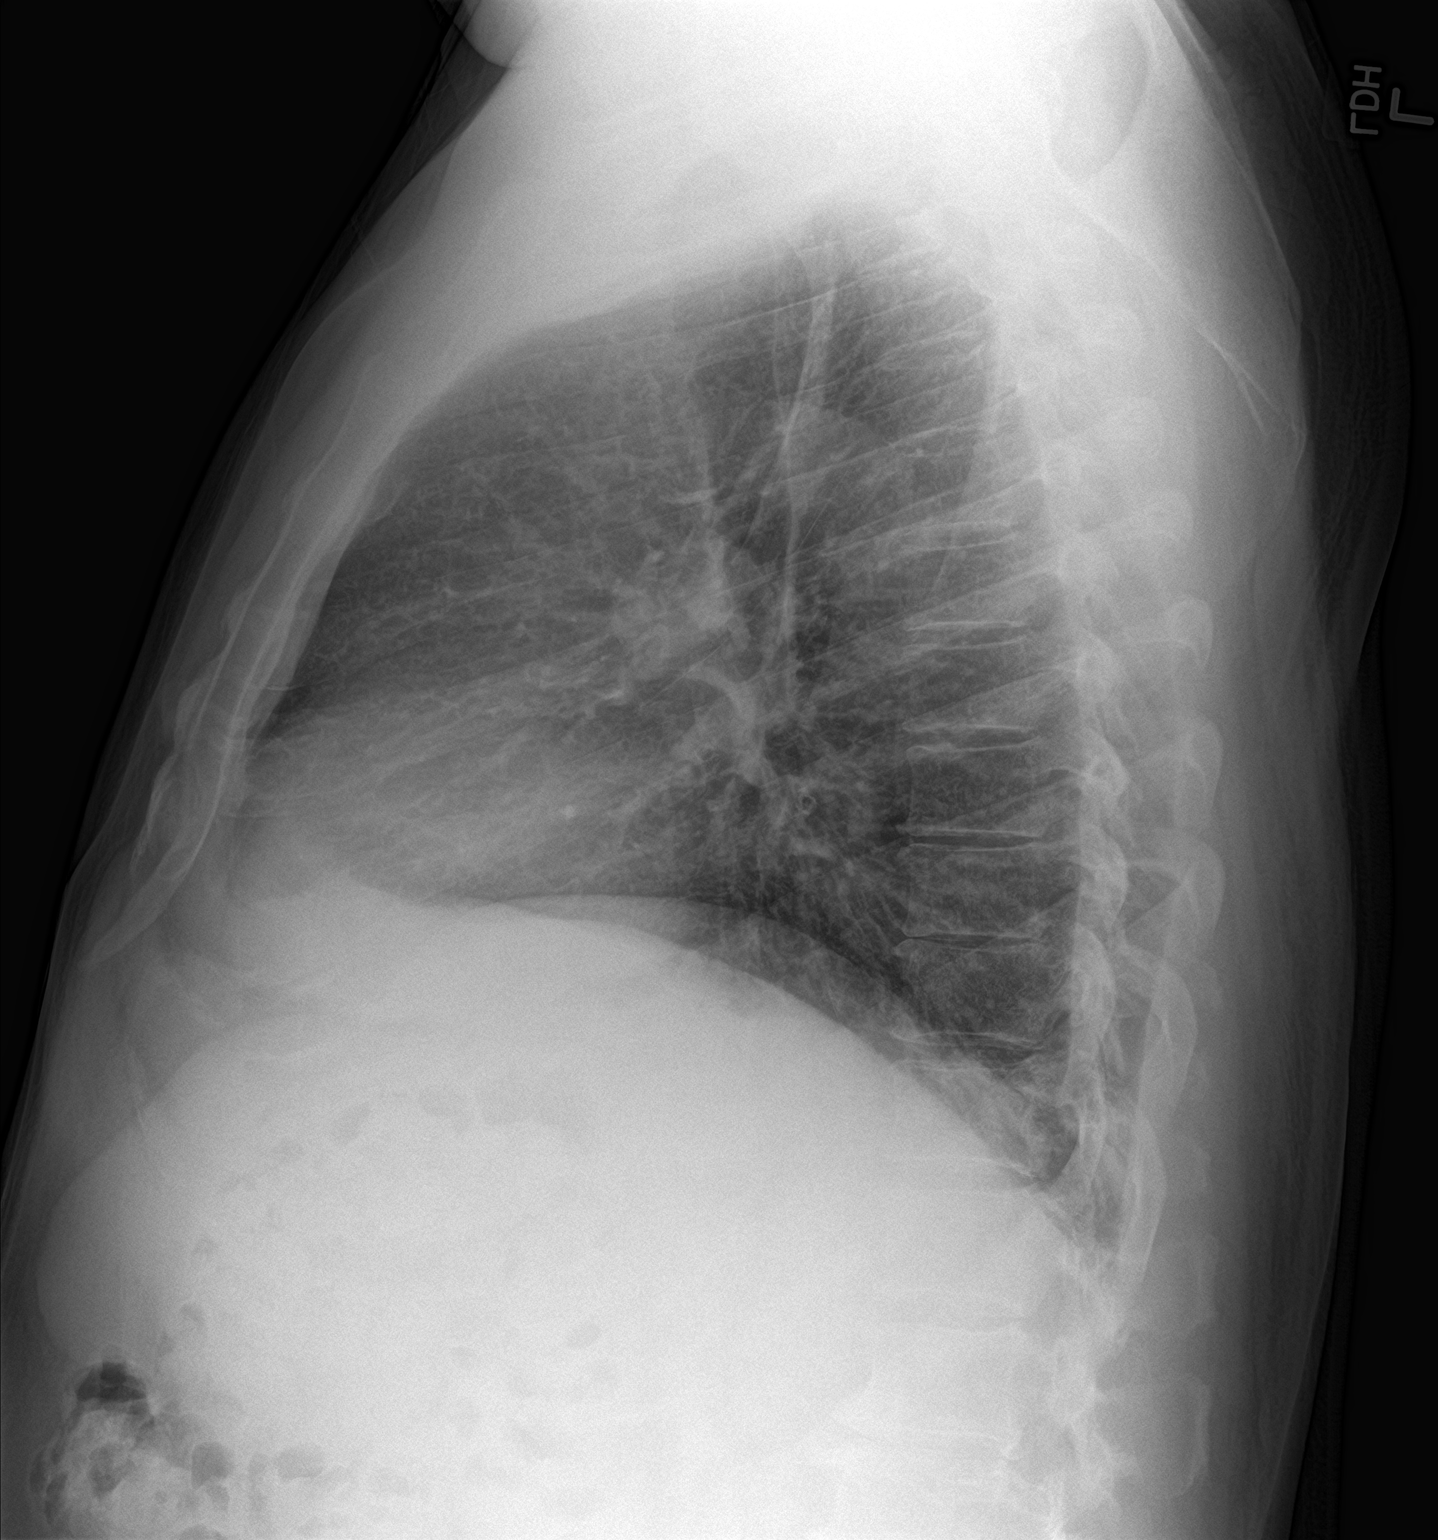

[2 of 2 positions shown; findings below may reference images not displayed]

FINDINGS: Suboptimal inspiration accounts for crowded bronchovascular
markings, especially in the bases, and accentuates the cardiac
silhouette. Taking this into account, cardiac silhouette normal in
size, unchanged. Thoracic aorta mildly tortuous, unchanged. Hilar
and mediastinal contours otherwise unremarkable. Mildly prominent
bronchovascular markings diffusely and mild central peribronchial
thickening, similar to the prior examination. Lungs otherwise clear.
No localized airspace consolidation. No pleural effusions. No
pneumothorax. Normal pulmonary vascularity. Degenerative changes
involving the lower thoracic spine and upper lumbar spine.
IMPRESSION: Suboptimal inspiration. Mild changes of asthma and/or bronchitis
without focal airspace pneumonia.

## 2021-03-18 DEATH — deceased

## 2021-05-28 DIAGNOSIS — E781 Pure hyperglyceridemia: Secondary | ICD-10-CM | POA: Diagnosis not present

## 2021-05-28 DIAGNOSIS — I10 Essential (primary) hypertension: Secondary | ICD-10-CM | POA: Diagnosis not present

## 2021-05-28 DIAGNOSIS — E119 Type 2 diabetes mellitus without complications: Secondary | ICD-10-CM | POA: Diagnosis not present

## 2021-07-10 ENCOUNTER — Other Ambulatory Visit (INDEPENDENT_AMBULATORY_CARE_PROVIDER_SITE_OTHER): Payer: Self-pay | Admitting: Nurse Practitioner

## 2021-07-10 DIAGNOSIS — M79605 Pain in left leg: Secondary | ICD-10-CM

## 2021-07-10 DIAGNOSIS — M79604 Pain in right leg: Secondary | ICD-10-CM

## 2021-07-15 ENCOUNTER — Encounter (INDEPENDENT_AMBULATORY_CARE_PROVIDER_SITE_OTHER): Payer: Self-pay | Admitting: Vascular Surgery

## 2021-07-15 ENCOUNTER — Other Ambulatory Visit: Payer: Self-pay

## 2021-07-15 ENCOUNTER — Ambulatory Visit (INDEPENDENT_AMBULATORY_CARE_PROVIDER_SITE_OTHER): Payer: Medicare HMO | Admitting: Vascular Surgery

## 2021-07-15 ENCOUNTER — Ambulatory Visit (INDEPENDENT_AMBULATORY_CARE_PROVIDER_SITE_OTHER): Payer: Medicare HMO

## 2021-07-15 VITALS — BP 120/70 | HR 73 | Ht 67.0 in | Wt 186.0 lb

## 2021-07-15 DIAGNOSIS — E782 Mixed hyperlipidemia: Secondary | ICD-10-CM

## 2021-07-15 DIAGNOSIS — I10 Essential (primary) hypertension: Secondary | ICD-10-CM

## 2021-07-15 DIAGNOSIS — M79605 Pain in left leg: Secondary | ICD-10-CM | POA: Diagnosis not present

## 2021-07-15 DIAGNOSIS — E119 Type 2 diabetes mellitus without complications: Secondary | ICD-10-CM | POA: Diagnosis not present

## 2021-07-15 DIAGNOSIS — M79604 Pain in right leg: Secondary | ICD-10-CM | POA: Diagnosis not present

## 2021-07-16 DIAGNOSIS — M79606 Pain in leg, unspecified: Secondary | ICD-10-CM | POA: Insufficient documentation

## 2021-07-16 DIAGNOSIS — E785 Hyperlipidemia, unspecified: Secondary | ICD-10-CM | POA: Insufficient documentation

## 2021-07-16 NOTE — Progress Notes (Signed)
MRN : [id number]  Joe Hart is a 65 y.o. (02/21/1956) male who presents with chief complaint of leg pain.  History of Present Illness:   The patient is seen for evaluation of painful lower extremities mostly the left. Patient notes the pain is variable and not associated with activity.  He states that occurs primarily with lying down and shoots down the leg.  It has not happened in a while and on discussion with him it does appear to have been related to a fall that he sustained at work where a barrel impacted his hip.  He denies any known degenerative joint disease of the lumbar spine.  He also notes it happens on long drives.    The patient denies rest pain or dangling of an extremity off the side of the bed during the night for relief. No open wounds or sores at this time. No history of DVT or phlebitis. No prior interventions or surgeries.  There is not a  history of back problems and DJD of the lumbar and sacral spine.  ABIs were obtained today which are normal bilaterally with triphasic signals  Current Meds  Medication Sig   amLODipine (NORVASC) 10 MG tablet Take 10 mg by mouth daily.   atorvastatin (LIPITOR) 20 MG tablet Take 20 mg by mouth daily.   Dutasteride-Tamsulosin HCl 0.5-0.4 MG CAPS    FLUoxetine (PROZAC) 20 MG tablet Take 20 mg by mouth daily.   gemfibrozil (LOPID) 600 MG tablet Take 600 mg by mouth daily.   glipiZIDE (GLUCOTROL) 5 MG tablet Take 5 mg by mouth 2 (two) times daily before a meal.   hydrALAZINE (APRESOLINE) 25 MG tablet Take 25 mg by mouth 2 (two) times daily.   hydrochlorothiazide (HYDRODIURIL) 25 MG tablet Take 25 mg by mouth daily.   linagliptin (TRADJENTA) 5 MG TABS tablet Take 5 mg by mouth daily.   magnesium oxide (MAG-OX) 400 (240 Mg) MG tablet Take 1 tablet by mouth 2 (two) times daily.   quinapril (ACCUPRIL) 20 MG tablet Take 20 mg by mouth daily.    Past Medical History:  Diagnosis Date   Diabetes mellitus without complication ([place])     Pt takes Metformin.   Hypertension     Past Surgical History:  Procedure Laterality Date   LIPOMA EXCISION Right 04/22/2016   Procedure: EXCISION LIPOMA NECK;  Surgeon: Clyde Canterbury, MD;  Location: [facility];  Service: ENT;  Laterality: Right;   UVULOPALATOPHARYNGOPLASTY      Social History Social History   Tobacco Use   Smoking status: Former    Types: Cigarettes    Quit date: 09/05/2011    Years since quitting: 9.8   Smokeless tobacco: Never  Substance Use Topics   Alcohol use: Yes    Comment: occasionally   Drug use: No    Family History No family history on file.  No Known Allergies   REVIEW OF SYSTEMS (Negative unless checked)  Constitutional: [] Weight loss  [] Fever  [] Chills Cardiac: [] Chest pain   [] Chest pressure   [] Palpitations   [] Shortness of breath when laying flat   [] Shortness of breath with exertion. Vascular:  [] Pain in legs with walking   [] Pain in legs at rest  [] History of DVT   [] Phlebitis   [] Swelling in legs   [] Varicose veins   [] Non-healing ulcers Pulmonary:   [] Uses home oxygen   [] Productive cough   [] Hemoptysis   [] Wheeze  [] COPD   [] Asthma Neurologic:  [] Dizziness   [] Seizures   []   History of stroke   [] History of TIA  [] Aphasia   [] Vissual changes   [] Weakness or numbness in arm   [] Weakness or numbness in leg Musculoskeletal:   [] Joint swelling   [] Joint pain   [] Low back pain Hematologic:  [] Easy bruising  [] Easy bleeding   [] Hypercoagulable state   [] Anemic Gastrointestinal:  [] Diarrhea   [] Vomiting  [] Gastroesophageal reflux/heartburn   [] Difficulty swallowing. Genitourinary:  [] Chronic kidney disease   [] Difficult urination  [] Frequent urination   [] Blood in urine Skin:  [] Rashes   [] Ulcers  Psychological:  [] History of anxiety   []  History of major depression.  Physical Examination  Vitals:   07/15/21 1424  BP: 120/70  Pulse: 73  Weight: 186 lb (84.4 kg)  Height: 5\' 7"  (1.702 m)   Body mass index is 29.13  kg/m. Gen: WD/WN, NAD Head: [place]/AT, No temporalis wasting.  Ear/Nose/Throat: Hearing grossly intact, nares w/o erythema or drainage Eyes: PER, EOMI, sclera nonicteric.  Neck: Supple, no masses.  No bruit or JVD.  Pulmonary:  Good air movement, no audible wheezing, no use of accessory muscles.  Cardiac: RRR, normal S1, S2, no Murmurs. Vascular:   Vessel Right Left  Radial Palpable Palpable  Carotid Palpable Palpable  PT Palpable Palpable  DP Palpable Palpable  Gastrointestinal: soft, non-distended. No guarding/no peritoneal signs.  Musculoskeletal: M/S 5/5 throughout.  No visible deformity.  Neurologic: CN 2-12 intact. Pain and light touch intact in extremities.  Symmetrical.  Speech is fluent. Motor exam as listed above. Psychiatric: Judgment intact, Mood & affect appropriate for pt's clinical situation. Dermatologic: No rashes or ulcers noted.  No changes consistent with cellulitis.   CBC Lab Results  Component Value Date   WBC 12.2 (H) 02/15/2012   HGB 13.5 02/15/2012   HCT 39.6 (L) 02/15/2012   MCV 91 02/15/2012   PLT 224 02/15/2012    BMET    Component Value Date/Time   NA 139 02/15/2012 1145   K 3.7 02/15/2012 1145   CL 104 02/15/2012 1145   CO2 28 02/15/2012 1145   GLUCOSE 249 (H) 02/15/2012 1145   BUN 16 02/15/2012 1145   CREATININE 1.10 04/11/2016 0844   CREATININE 1.12 02/15/2012 1145   CALCIUM 8.6 02/15/2012 1145   GFRNONAA >60 02/15/2012 1145   GFRAA >60 02/15/2012 1145   CrCl cannot be calculated (Patient's most recent lab result is older than the maximum 21 days allowed.).  COAG No results found for: INR, PROTIME  Radiology No results found.   Assessment/Plan 1. Pain of left lower extremity Recommend:  I do not find evidence of life style limiting vascular disease. The patient specifically denies life style limitation.  Previous noninvasive studies including ABI's of the legs do not identify critical vascular problems.  The patient should  continue walking and begin a more formal exercise program. The patient should continue his antiplatelet therapy and aggressive treatment of the lipid abnormalities.  The patient should begin wearing graduated compression socks 15-20 mmHg strength to control her mild edema.  Patient will follow-up with me on a PRN basis   2. Primary hypertension Continue antihypertensive medications as already ordered, these medications have been reviewed and there are no changes at this time.   3. Diabetes mellitus without complication ([place]) Continue hypoglycemic medications as already ordered, these medications have been reviewed and there are no changes at this time.  Hgb A1C to be monitored as already arranged by primary service   4. Mixed hyperlipidemia Continue statin as ordered and reviewed, no  changes at this time     Hortencia Pilar, MD  07/16/2021 6:17 PM

## 2021-09-09 DIAGNOSIS — E119 Type 2 diabetes mellitus without complications: Secondary | ICD-10-CM | POA: Diagnosis not present

## 2021-09-09 DIAGNOSIS — Z0130 Encounter for examination of blood pressure without abnormal findings: Secondary | ICD-10-CM | POA: Diagnosis not present

## 2021-09-09 DIAGNOSIS — I10 Essential (primary) hypertension: Secondary | ICD-10-CM | POA: Diagnosis not present

## 2021-09-09 DIAGNOSIS — E781 Pure hyperglyceridemia: Secondary | ICD-10-CM | POA: Diagnosis not present

## 2021-09-09 DIAGNOSIS — Z0000 Encounter for general adult medical examination without abnormal findings: Secondary | ICD-10-CM | POA: Diagnosis not present

## 2021-12-17 DIAGNOSIS — M65342 Trigger finger, left ring finger: Secondary | ICD-10-CM | POA: Diagnosis not present

## 2021-12-17 DIAGNOSIS — E119 Type 2 diabetes mellitus without complications: Secondary | ICD-10-CM | POA: Diagnosis not present

## 2022-02-06 DIAGNOSIS — Z712 Person consulting for explanation of examination or test findings: Secondary | ICD-10-CM | POA: Diagnosis not present

## 2022-02-06 DIAGNOSIS — Z1389 Encounter for screening for other disorder: Secondary | ICD-10-CM | POA: Diagnosis not present

## 2022-02-06 DIAGNOSIS — Z125 Encounter for screening for malignant neoplasm of prostate: Secondary | ICD-10-CM | POA: Diagnosis not present

## 2022-02-06 DIAGNOSIS — E781 Pure hyperglyceridemia: Secondary | ICD-10-CM | POA: Diagnosis not present

## 2022-02-06 DIAGNOSIS — Z0131 Encounter for examination of blood pressure with abnormal findings: Secondary | ICD-10-CM | POA: Diagnosis not present

## 2022-02-06 DIAGNOSIS — Z0130 Encounter for examination of blood pressure without abnormal findings: Secondary | ICD-10-CM | POA: Diagnosis not present

## 2022-02-06 DIAGNOSIS — E119 Type 2 diabetes mellitus without complications: Secondary | ICD-10-CM | POA: Diagnosis not present

## 2022-02-06 DIAGNOSIS — I10 Essential (primary) hypertension: Secondary | ICD-10-CM | POA: Diagnosis not present

## 2022-06-03 DIAGNOSIS — Z712 Person consulting for explanation of examination or test findings: Secondary | ICD-10-CM | POA: Diagnosis not present

## 2022-06-03 DIAGNOSIS — Z23 Encounter for immunization: Secondary | ICD-10-CM | POA: Diagnosis not present

## 2022-06-03 DIAGNOSIS — Z0131 Encounter for examination of blood pressure with abnormal findings: Secondary | ICD-10-CM | POA: Diagnosis not present

## 2022-06-03 DIAGNOSIS — Z1389 Encounter for screening for other disorder: Secondary | ICD-10-CM | POA: Diagnosis not present

## 2022-06-03 DIAGNOSIS — E119 Type 2 diabetes mellitus without complications: Secondary | ICD-10-CM | POA: Diagnosis not present

## 2022-06-03 DIAGNOSIS — I10 Essential (primary) hypertension: Secondary | ICD-10-CM | POA: Diagnosis not present

## 2022-06-03 DIAGNOSIS — G2581 Restless legs syndrome: Secondary | ICD-10-CM | POA: Diagnosis not present

## 2022-06-03 DIAGNOSIS — R7309 Other abnormal glucose: Secondary | ICD-10-CM | POA: Diagnosis not present

## 2022-06-03 DIAGNOSIS — Z0130 Encounter for examination of blood pressure without abnormal findings: Secondary | ICD-10-CM | POA: Diagnosis not present

## 2022-06-16 ENCOUNTER — Encounter (INDEPENDENT_AMBULATORY_CARE_PROVIDER_SITE_OTHER): Payer: Self-pay

## 2022-09-11 DIAGNOSIS — H524 Presbyopia: Secondary | ICD-10-CM | POA: Diagnosis not present

## 2022-09-11 DIAGNOSIS — E119 Type 2 diabetes mellitus without complications: Secondary | ICD-10-CM | POA: Diagnosis not present

## 2022-12-25 DIAGNOSIS — E781 Pure hyperglyceridemia: Secondary | ICD-10-CM | POA: Diagnosis not present

## 2022-12-25 DIAGNOSIS — Z0130 Encounter for examination of blood pressure without abnormal findings: Secondary | ICD-10-CM | POA: Diagnosis not present

## 2022-12-25 DIAGNOSIS — E119 Type 2 diabetes mellitus without complications: Secondary | ICD-10-CM | POA: Diagnosis not present

## 2022-12-25 DIAGNOSIS — Z1389 Encounter for screening for other disorder: Secondary | ICD-10-CM | POA: Diagnosis not present

## 2022-12-25 DIAGNOSIS — I10 Essential (primary) hypertension: Secondary | ICD-10-CM | POA: Diagnosis not present

## 2022-12-25 DIAGNOSIS — Z712 Person consulting for explanation of examination or test findings: Secondary | ICD-10-CM | POA: Diagnosis not present

## 2023-01-15 DIAGNOSIS — Z1211 Encounter for screening for malignant neoplasm of colon: Secondary | ICD-10-CM | POA: Diagnosis not present

## 2023-01-22 LAB — EXTERNAL GENERIC LAB PROCEDURE: COLOGUARD: POSITIVE — AB

## 2023-01-22 LAB — COLOGUARD: Cologuard: POSITIVE — AB

## 2023-01-28 ENCOUNTER — Telehealth: Payer: Self-pay | Admitting: *Deleted

## 2023-01-28 ENCOUNTER — Telehealth: Payer: Self-pay

## 2023-01-28 ENCOUNTER — Other Ambulatory Visit: Payer: Self-pay

## 2023-01-28 ENCOUNTER — Other Ambulatory Visit: Payer: Self-pay | Admitting: *Deleted

## 2023-01-28 ENCOUNTER — Encounter: Payer: Self-pay | Admitting: *Deleted

## 2023-01-28 DIAGNOSIS — R195 Other fecal abnormalities: Secondary | ICD-10-CM

## 2023-01-28 MED ORDER — NA SULFATE-K SULFATE-MG SULF 17.5-3.13-1.6 GM/177ML PO SOLN: 1.0000 | Freq: Once | ORAL | 0 refills | Status: AC

## 2023-01-28 NOTE — Telephone Encounter (Signed)
Gastroenterology Pre-Procedure Review  Request Date: 03/23/2023 Requesting Physician: Dr. Servando Snare  PATIENT REVIEW QUESTIONS: The patient responded to the following health history questions as indicated:    1. Are you having any GI issues? no 2. Do you have a personal history of Polyps? no 3. Do you have a family history of Colon Cancer or Polyps? no 4. Diabetes Mellitus? yes (taking glipizide, Tradjenta, metformin ) 5. Joint replacements in the past 12 months?no 6. Major health problems in the past 3 months?no 7. Any artificial heart valves, MVP, or defibrillator?no    MEDICATIONS & ALLERGIES:    Patient reports the following regarding taking any anticoagulation/antiplatelet therapy:   Plavix, Coumadin, Eliquis, Xarelto, Lovenox, Pradaxa, Brilinta, or Effient? no Aspirin? no  Patient confirms/reports the following medications:  Current Outpatient Medications  Medication Sig Dispense Refill   Na Sulfate-K Sulfate-Mg Sulf 17.5-3.13-1.6 GM/177ML SOLN Take 1 kit by mouth once for 1 dose. 354 mL 0   amLODipine (NORVASC) 10 MG tablet Take 10 mg by mouth daily.     atorvastatin (LIPITOR) 20 MG tablet Take 20 mg by mouth daily.     Dutasteride-Tamsulosin HCl 0.5-0.4 MG CAPS      FLUoxetine (PROZAC) 20 MG tablet Take 20 mg by mouth daily.     gemfibrozil (LOPID) 600 MG tablet Take 600 mg by mouth daily.     glipiZIDE (GLUCOTROL) 5 MG tablet Take 5 mg by mouth 2 (two) times daily before a meal.     hydrALAZINE (APRESOLINE) 25 MG tablet Take 25 mg by mouth 2 (two) times daily.     hydrochlorothiazide (HYDRODIURIL) 25 MG tablet Take 25 mg by mouth daily.     HYDROcodone-acetaminophen (NORCO/VICODIN) 5-325 MG tablet Take 1-2 tablets by mouth every 6 (six) hours as needed for moderate pain. 40 tablet 0   linagliptin (TRADJENTA) 5 MG TABS tablet Take 5 mg by mouth daily.     magnesium oxide (MAG-OX) 400 (240 Mg) MG tablet Take 1 tablet by mouth 2 (two) times daily.     metFORMIN (GLUCOPHAGE) 500 MG  tablet Take 500 mg by mouth 2 (two) times daily with a meal.     quinapril (ACCUPRIL) 20 MG tablet Take 20 mg by mouth daily.     No current facility-administered medications for this visit.    Patient confirms/reports the following allergies:  No Known Allergies  No orders of the defined types were placed in this encounter.   AUTHORIZATION INFORMATION Primary Insurance: 1D#: Group #:  Secondary Insurance: 1D#: Group #:  SCHEDULE INFORMATION: Date: 03/23/2023 Time: Location: [facility]

## 2023-01-28 NOTE — Telephone Encounter (Signed)
Pt left vmm to schedule colonoscopy please return call 

## 2023-01-28 NOTE — Telephone Encounter (Signed)
Colonoscopy schedule on 03/23/2023 Dr Servando Snare at [facility]

## 2023-03-16 ENCOUNTER — Encounter: Payer: Self-pay | Admitting: Gastroenterology

## 2023-03-16 NOTE — Anesthesia Preprocedure Evaluation (Addendum)
Anesthesia Evaluation  Patient identified by MRN, date of birth, ID band Patient awake    Reviewed: Allergy & Precautions, NPO status , Patient's Chart, lab work & pertinent test results  History of Anesthesia Complications Negative for: history of anesthetic complications  Airway Mallampati: III  TM Distance: >3 FB Neck ROM: full    Dental  (+) Lower Dentures, Upper Dentures   Pulmonary sleep apnea , former smoker   Pulmonary exam normal        Cardiovascular Exercise Tolerance: Good hypertension, negative cardio ROS Normal cardiovascular exam     Neuro/Psych negative neurological ROS  negative psych ROS   GI/Hepatic Neg liver ROS,GERD  Controlled,,  Endo/Other  diabetes, Type 2    Renal/GU negative Renal ROS  negative genitourinary   Musculoskeletal   Abdominal   Peds  Hematology negative hematology ROS (+)   Anesthesia Other Findings Past Medical History: No date: Diabetes mellitus without complication (HCC)     Comment:  Pt takes Metformin. No date: Hypertension   Reproductive/Obstetrics negative OB ROS                             Anesthesia Physical Anesthesia Plan  ASA: 3  Anesthesia Plan: General   Post-op Pain Management:    Induction: Intravenous  PONV Risk Score and Plan: Propofol infusion and TIVA  Airway Management Planned: Natural Airway and Nasal Cannula  Additional Equipment:   Intra-op Plan:   Post-operative Plan:   Informed Consent: I have reviewed the patients History and Physical, chart, labs and discussed the procedure including the risks, benefits and alternatives for the proposed anesthesia with the patient or authorized representative who has indicated his/her understanding and acceptance.     Dental Advisory Given  Plan Discussed with: Anesthesiologist, CRNA and Surgeon  Anesthesia Plan Comments: (Patient consented for risks of anesthesia  including but not limited to:  - adverse reactions to medications - risk of airway placement if required - damage to eyes, teeth, lips or other oral mucosa - nerve damage due to positioning  - sore throat or hoarseness - Damage to heart, brain, nerves, lungs, other parts of body or loss of life  Patient voiced understanding.)       Anesthesia Quick Evaluation

## 2023-03-23 ENCOUNTER — Other Ambulatory Visit
Admission: RE | Admit: 2023-03-23 | Discharge: 2023-03-23 | Disposition: A | Discharge status: Home / Self Care | Payer: Medicare HMO | Attending: Urgent Care | Admitting: Urgent Care

## 2023-03-23 ENCOUNTER — Other Ambulatory Visit: Payer: Self-pay

## 2023-03-23 ENCOUNTER — Ambulatory Visit: Payer: Medicare HMO | Admitting: Anesthesiology

## 2023-03-23 ENCOUNTER — Ambulatory Visit
Admission: RE | Admit: 2023-03-23 | Discharge: 2023-03-23 | Disposition: A | Discharge status: Home / Self Care | Payer: Medicare HMO | Attending: Gastroenterology | Admitting: Gastroenterology

## 2023-03-23 ENCOUNTER — Encounter
Admission: RE | Disposition: A | Discharge status: Home / Self Care | Payer: Self-pay | Source: Home / Self Care | Attending: Gastroenterology

## 2023-03-23 ENCOUNTER — Encounter: Payer: Self-pay | Admitting: Gastroenterology

## 2023-03-23 DIAGNOSIS — G4730 Sleep apnea, unspecified: Secondary | ICD-10-CM | POA: Insufficient documentation

## 2023-03-23 DIAGNOSIS — R195 Other fecal abnormalities: Secondary | ICD-10-CM

## 2023-03-23 DIAGNOSIS — Z1211 Encounter for screening for malignant neoplasm of colon: Secondary | ICD-10-CM | POA: Insufficient documentation

## 2023-03-23 DIAGNOSIS — K635 Polyp of colon: Secondary | ICD-10-CM

## 2023-03-23 DIAGNOSIS — I10 Essential (primary) hypertension: Secondary | ICD-10-CM | POA: Diagnosis not present

## 2023-03-23 DIAGNOSIS — K5730 Diverticulosis of large intestine without perforation or abscess without bleeding: Secondary | ICD-10-CM | POA: Insufficient documentation

## 2023-03-23 DIAGNOSIS — K641 Second degree hemorrhoids: Secondary | ICD-10-CM | POA: Diagnosis not present

## 2023-03-23 DIAGNOSIS — E119 Type 2 diabetes mellitus without complications: Secondary | ICD-10-CM | POA: Insufficient documentation

## 2023-03-23 DIAGNOSIS — Z01818 Encounter for other preprocedural examination: Secondary | ICD-10-CM

## 2023-03-23 DIAGNOSIS — Z87891 Personal history of nicotine dependence: Secondary | ICD-10-CM | POA: Insufficient documentation

## 2023-03-23 HISTORY — PX: POLYPECTOMY: SHX5525

## 2023-03-23 HISTORY — PX: COLONOSCOPY WITH PROPOFOL: SHX5780

## 2023-03-23 LAB — POTASSIUM: Potassium: 4 mmol/L (ref 3.5–5.1)

## 2023-03-23 SURGERY — COLONOSCOPY WITH PROPOFOL
Anesthesia: General

## 2023-03-23 MED ORDER — SODIUM CHLORIDE 0.9 % IV SOLN: INTRAVENOUS | Status: DC

## 2023-03-23 MED ORDER — PROPOFOL 10 MG/ML IV BOLUS
INTRAVENOUS | Status: DC | PRN
Start: 2023-03-23 — End: 2023-03-23
  Administered 2023-03-23: 40 mg via INTRAVENOUS
  Administered 2023-03-23: 20 mg via INTRAVENOUS
  Administered 2023-03-23: 40 mg via INTRAVENOUS
  Administered 2023-03-23: 20 mg via INTRAVENOUS
  Administered 2023-03-23: 40 mg via INTRAVENOUS
  Administered 2023-03-23: 100 mg via INTRAVENOUS

## 2023-03-23 MED ORDER — STERILE WATER FOR IRRIGATION IR SOLN
Status: DC | PRN
  Administered 2023-03-23: 60 mL

## 2023-03-23 MED ORDER — LACTATED RINGERS IV SOLN: INTRAVENOUS | Status: DC

## 2023-03-23 MED ORDER — LIDOCAINE HCL (CARDIAC) PF 100 MG/5ML IV SOSY
PREFILLED_SYRINGE | INTRAVENOUS | Status: DC | PRN
  Administered 2023-03-23: 60 mg via INTRAVENOUS

## 2023-03-23 SURGICAL SUPPLY — 22 items
CLIP HMST 235XBRD CATH ROT (MISCELLANEOUS) IMPLANT
CLIP RESOLUTION 360 11X235 (MISCELLANEOUS) ×4
ELECT REM PT RETURN 9FT ADLT (ELECTROSURGICAL)
ELECTRODE REM PT RTRN 9FT ADLT (ELECTROSURGICAL) IMPLANT
FORCEPS BIOP RAD 4 LRG CAP 4 (CUTTING FORCEPS) IMPLANT
GOWN CVR UNV OPN BCK APRN NK (MISCELLANEOUS) ×4 IMPLANT
GOWN ISOL THUMB LOOP REG UNIV (MISCELLANEOUS) ×4
INJECTABLE ELEVIEW COMP 10 (MISCELLANEOUS) IMPLANT
INJECTOR VARIJECT VIN23 (MISCELLANEOUS) IMPLANT
KIT DEFENDO VALVE AND CONN (KITS) IMPLANT
KIT PRC NS LF DISP ENDO (KITS) ×2 IMPLANT
KIT PROCEDURE OLYMPUS (KITS) ×2
MANIFOLD NEPTUNE II (INSTRUMENTS) ×2 IMPLANT
MARKER SPOT ENDO TATTOO 5ML (MISCELLANEOUS) IMPLANT
PROBE APC STR FIRE (PROBE) IMPLANT
RETRIEVER NET ROTH 2.5X230 LF (MISCELLANEOUS) IMPLANT
SNARE COLD EXACTO (MISCELLANEOUS) IMPLANT
SNARE SHORT THROW 13M SML OVAL (MISCELLANEOUS) IMPLANT
SNARE SNG USE RND 15MM (INSTRUMENTS) IMPLANT
TRAP ETRAP POLY (MISCELLANEOUS) IMPLANT
VARIJECT INJECTOR VIN23 (MISCELLANEOUS) ×2
WATER STERILE IRR 250ML POUR (IV SOLUTION) ×2 IMPLANT

## 2023-03-23 NOTE — H&P (Signed)
Joe Minium, MD [facility] [address] [place], [place] [address] Phone:[phone] Fax : [phone]  Primary Care Physician:  Leanna Sato, MD Primary Gastroenterologist:  Dr. Servando Snare  Pre-Procedure History & Physical: HPI:  Joe Hart is a 67 y.o. male is here for an colonoscopy.   Past Medical History:  Diagnosis Date   Diabetes mellitus without complication (HCC)    Pt takes Metformin.   Hypertension     Past Surgical History:  Procedure Laterality Date   LIPOMA EXCISION Right 04/22/2016   Procedure: EXCISION LIPOMA NECK;  Surgeon: Geanie Logan, MD;  Location: [facility];  Service: ENT;  Laterality: Right;   UVULOPALATOPHARYNGOPLASTY      Prior to Admission medications   Medication Sig Start Date End Date Taking? Authorizing Provider  amLODipine (NORVASC) 10 MG tablet Take 10 mg by mouth daily.   Yes [provider]  atorvastatin (LIPITOR) 20 MG tablet Take 20 mg by mouth daily.   Yes [provider]  finasteride (PROSCAR) 5 MG tablet Take 5 mg by mouth daily.   Yes [provider]  FLUoxetine (PROZAC) 20 MG tablet Take 20 mg by mouth daily.   Yes [provider]  glipiZIDE (GLUCOTROL) 5 MG tablet Take 5 mg by mouth 2 (two) times daily before a meal.   Yes [provider]  hydrALAZINE (APRESOLINE) 25 MG tablet Take 25 mg by mouth 2 (two) times daily.   Yes [provider]  hydrochlorothiazide (HYDRODIURIL) 25 MG tablet Take 25 mg by mouth daily.   Yes [provider]  linagliptin (TRADJENTA) 5 MG TABS tablet Take 5 mg by mouth daily.   Yes [provider]  lisinopril (ZESTRIL) 40 MG tablet Take 40 mg by mouth daily.   Yes [provider]  magnesium oxide (MAG-OX) 400 (240 Mg) MG tablet Take 1 tablet by mouth 2 (two) times daily. 06/20/21  Yes [provider]  metFORMIN (GLUCOPHAGE) 500 MG tablet Take 500 mg by mouth 2 (two) times daily with a meal.   Yes [provider]  tiZANidine (ZANAFLEX) 4 MG capsule Take 4 mg by mouth 3 (three) times daily.   Yes [provider]  Dutasteride-Tamsulosin HCl 0.5-0.4 MG CAPS     [provider]  gemfibrozil (LOPID) 600 MG tablet Take 600 mg by mouth daily. Patient not taking: Reported on 03/16/2023    [provider]  HYDROcodone-acetaminophen (NORCO/VICODIN) 5-325 MG tablet Take 1-2 tablets by mouth every 6 (six) hours as needed for moderate pain. Patient not taking: Reported on 03/16/2023 04/22/16   Geanie Logan, MD  quinapril (ACCUPRIL) 20 MG tablet Take 20 mg by mouth daily. Patient not taking: Reported on 03/16/2023    [provider]    Allergies as of 01/28/2023   (No Known Allergies)    History reviewed. No pertinent family history.  Social History   Socioeconomic History   Marital status: Married    Spouse name: Not on file   Number of children: Not on file   Years of education: Not on file   Highest education level: Not on file  Occupational History   Not on file  Tobacco Use   Smoking status: Former    Current packs/day: 0.00    Types: Cigarettes    Quit date: 09/05/2011    Years since quitting: 11.5   Smokeless tobacco: Never  Substance and Sexual Activity   Alcohol use: Yes    Comment: occasionally   Drug  use: No   Sexual activity: Not Currently  Other Topics Concern   Not on file  Social History Narrative   Not on file   Social Determinants of Health   Financial Resource Strain: Not on file  Food Insecurity: Not on file  Transportation Needs: Not on file  Physical Activity: Not on file  Stress: Not on file  Social Connections: Not on file  Intimate Partner Violence: Not on file    Review of Systems: See HPI, otherwise negative ROS  Physical Exam: Pulse 72   Temp 98.1 F (36.7 C) (Temporal)   Resp 14   Ht 5\' 8"  (1.727 m)   Wt 82.1 kg   SpO2 98%   BMI 27.52 kg/m  General:   Alert,  pleasant and cooperative in NAD Head:   Normocephalic and atraumatic. Neck:  Supple; no masses or thyromegaly. Lungs:  Clear throughout to auscultation.    Heart:  Regular rate and rhythm. Abdomen:  Soft, nontender and nondistended. Normal bowel sounds, without guarding, and without rebound.   Neurologic:  Alert and  oriented x4;  grossly normal neurologically.  Impression/Plan: Joe Hart is here for an colonoscopy to be performed for positive cologard  Risks, benefits, limitations, and alternatives regarding  colonoscopy have been reviewed with the patient.  Questions have been answered.  All parties agreeable.   Joe Minium, MD  03/23/2023, 10:27 AM

## 2023-03-23 NOTE — Anesthesia Postprocedure Evaluation (Signed)
Anesthesia Post Note  Patient: Joe Hart  Procedure(s) Performed: COLONOSCOPY WITH PROPOFOL POLYPECTOMY  Patient location during evaluation: PACU Anesthesia Type: General Level of consciousness: awake and alert Pain management: pain level controlled Vital Signs Assessment: post-procedure vital signs reviewed and stable Respiratory status: spontaneous breathing, nonlabored ventilation, respiratory function stable and patient connected to nasal cannula oxygen Cardiovascular status: blood pressure returned to baseline and stable Postop Assessment: no apparent nausea or vomiting Anesthetic complications: no   No notable events documented.   Last Vitals:  Vitals:   03/23/23 1130 03/23/23 1136  BP: 114/83 127/85  Pulse: 67 64  Resp: 18 18  Temp: (!) 36.4 C (!) 36.3 C  SpO2: 98% 98%    Last Pain:  Vitals:   03/23/23 1136  TempSrc:   PainSc: 0-No pain                 Cleda Mccreedy Marlys Stegmaier

## 2023-03-23 NOTE — Transfer of Care (Signed)
Immediate Anesthesia Transfer of Care Note  Patient: Joe Hart  Procedure(s) Performed: COLONOSCOPY WITH PROPOFOL POLYPECTOMY  Patient Location: PACU  Anesthesia Type: General  Level of Consciousness: awake, alert  and patient cooperative  Airway and Oxygen Therapy: Patient Spontanous Breathing and Patient connected to supplemental oxygen  Post-op Assessment: Post-op Vital signs reviewed, Patient's Cardiovascular Status Stable, Respiratory Function Stable, Patent Airway and No signs of Nausea or vomiting  Post-op Vital Signs: Reviewed and stable  Complications: No notable events documented.

## 2023-03-23 NOTE — Op Note (Addendum)
[facility] Gastroenterology Patient Name: Joe Hart Procedure Date: 03/23/2023 10:50 AM MRN: [id number] Account #: [id number] Date of Birth: [birth year] Admit Type: Outpatient Age: 67 Room: [facility] OR ROOM 01 Gender: Male Note Status: Finalized Instrument Name: [id number] Procedure:             Colonoscopy Indications:           Positive Cologuard test Providers:             Midge Minium MD, MD Referring MD:          Leanna Sato, MD (Referring MD) Medicines:             Propofol per Anesthesia Complications:         No immediate complications. Procedure:             Pre-Anesthesia Assessment:                        - Prior to the procedure, a History and Physical was                         performed, and patient medications and allergies were                         reviewed. The patient's tolerance of previous                         anesthesia was also reviewed. The risks and benefits                         of the procedure and the sedation options and risks                         were discussed with the patient. All questions were                         answered, and informed consent was obtained. Prior                         Anticoagulants: The patient has taken no anticoagulant                         or antiplatelet agents. ASA Grade Assessment: II - A                         patient with mild systemic disease. After reviewing                         the risks and benefits, the patient was deemed in                         satisfactory condition to undergo the procedure.                        After obtaining informed consent, the colonoscope was                         passed under direct vision. Throughout the procedure,  the patient's blood pressure, pulse, and oxygen                         saturations were monitored continuously. The                         Colonoscope was introduced through the anus and                          advanced to the the cecum, identified by appendiceal                         orifice and ileocecal valve. The colonoscopy was                         performed without difficulty. The patient tolerated                         the procedure well. The quality of the bowel                         preparation was adequate to identify polyps. Findings:      The perianal and digital rectal examinations were normal.      Three sessile polyps were found in the sigmoid colon. The polyps were 3       to 8 mm in size. These polyps were removed with a cold snare. Resection       and retrieval were complete.      A 15 mm polyp was found in the ascending colon. The polyp was sessile.       To close a defect after polypectomy, two hemostatic clips were       successfully placed (MR conditional). Clip manufacturer: [employer]. There was no bleeding at the end of the procedure.       Preparations were made for mucosal resection. Chromoscopy with Congo red       was done. Demarcation of the lesion was performed during the procedure       to clearly identify the boundaries of the lesion. Saline with indigo       carmine was injected to raise the lesion. Snare mucosal resection was       performed. Resection and retrieval were complete. Resected tissue       margins were examined and clear of polyp tissue.      Multiple small-mouthed diverticula were found in the sigmoid colon.      Non-bleeding internal hemorrhoids were found during retroflexion. The       hemorrhoids were Grade II (internal hemorrhoids that prolapse but reduce       spontaneously). Impression:            - Three 3 to 8 mm polyps in the sigmoid colon, removed                         with a cold snare. Resected and retrieved.                        - One 15 mm polyp in the ascending colon, removed with  mucosal resection. Resected and retrieved. Clips (MR                         conditional) were placed.  Clip manufacturer: [employer].                        - Diverticulosis in the sigmoid colon.                        - Non-bleeding internal hemorrhoids.                        - Mucosal resection was performed. Resection and                         retrieval were complete. Recommendation:        - Discharge patient to home.                        - Resume previous diet.                        - Continue present medications.                        - If the pathology report reveals adenomatous tissue,                         then repeat the colonoscopy for surveillance in 1 year. Procedure Code(s):     --- Professional ---                        [phone], Colonoscopy, flexible; with endoscopic mucosal                         resection                        [id number], 59, Colonoscopy, flexible; with removal of                         tumor(s), polyp(s), or other lesion(s) by snare                         technique Diagnosis Code(s):     --- Professional ---                        R19.5, Other fecal abnormalities                        D12.5, Benign neoplasm of sigmoid colon CPT copyright 2022 American Medical Association. All rights reserved. The codes documented in this report are preliminary and upon coder review may  be revised to meet current compliance requirements. Midge Minium MD, MD 03/23/2023 11:27:01 AM This report has been signed electronically. Number of Addenda: 0 Note Initiated On: 03/23/2023 10:50 AM Scope Withdrawal Time: 0 hours 20 minutes 45 seconds  Total Procedure Duration: 0 hours 24 minutes 44 seconds  Estimated Blood Loss:  Estimated blood  loss: none.      [facility]

## 2023-03-24 ENCOUNTER — Encounter: Payer: Self-pay | Admitting: Gastroenterology

## 2023-03-26 ENCOUNTER — Encounter: Payer: Self-pay | Admitting: Gastroenterology

## 2023-06-02 DIAGNOSIS — E119 Type 2 diabetes mellitus without complications: Secondary | ICD-10-CM | POA: Diagnosis not present

## 2023-06-02 DIAGNOSIS — Z125 Encounter for screening for malignant neoplasm of prostate: Secondary | ICD-10-CM | POA: Diagnosis not present

## 2023-06-02 DIAGNOSIS — Z1389 Encounter for screening for other disorder: Secondary | ICD-10-CM | POA: Diagnosis not present

## 2023-06-02 DIAGNOSIS — I10 Essential (primary) hypertension: Secondary | ICD-10-CM | POA: Diagnosis not present

## 2023-06-02 DIAGNOSIS — Z0130 Encounter for examination of blood pressure without abnormal findings: Secondary | ICD-10-CM | POA: Diagnosis not present

## 2023-06-02 DIAGNOSIS — E781 Pure hyperglyceridemia: Secondary | ICD-10-CM | POA: Diagnosis not present

## 2023-06-02 DIAGNOSIS — Z712 Person consulting for explanation of examination or test findings: Secondary | ICD-10-CM | POA: Diagnosis not present

## 2023-08-14 ENCOUNTER — Other Ambulatory Visit: Payer: Self-pay | Admitting: Student

## 2023-08-14 DIAGNOSIS — M47816 Spondylosis without myelopathy or radiculopathy, lumbar region: Secondary | ICD-10-CM | POA: Diagnosis not present

## 2023-08-14 DIAGNOSIS — M5416 Radiculopathy, lumbar region: Secondary | ICD-10-CM | POA: Diagnosis not present

## 2023-08-14 DIAGNOSIS — E119 Type 2 diabetes mellitus without complications: Secondary | ICD-10-CM | POA: Diagnosis not present

## 2023-08-28 ENCOUNTER — Encounter: Payer: Self-pay | Admitting: Student

## 2023-09-02 ENCOUNTER — Ambulatory Visit
Admission: RE | Admit: 2023-09-02 | Discharge: 2023-09-02 | Disposition: A | Discharge status: Home / Self Care | Payer: Medicare HMO | Source: Ambulatory Visit | Attending: Student | Admitting: Student

## 2023-09-02 DIAGNOSIS — M47816 Spondylosis without myelopathy or radiculopathy, lumbar region: Secondary | ICD-10-CM

## 2023-09-02 DIAGNOSIS — M5416 Radiculopathy, lumbar region: Secondary | ICD-10-CM

## 2023-09-02 DIAGNOSIS — M48061 Spinal stenosis, lumbar region without neurogenic claudication: Secondary | ICD-10-CM | POA: Diagnosis not present

## 2023-09-02 DIAGNOSIS — M5126 Other intervertebral disc displacement, lumbar region: Secondary | ICD-10-CM | POA: Diagnosis not present

## 2023-09-10 ENCOUNTER — Other Ambulatory Visit: Payer: Medicare HMO

## 2023-09-16 ENCOUNTER — Inpatient Hospital Stay
Admission: RE | Admit: 2023-09-16 | Discharge: 2023-09-16 | Disposition: A | Discharge status: Home / Self Care | Payer: Self-pay | Source: Ambulatory Visit | Attending: Neurosurgery | Admitting: Neurosurgery

## 2023-09-16 ENCOUNTER — Telehealth: Payer: Self-pay | Admitting: Neurosurgery

## 2023-09-16 ENCOUNTER — Other Ambulatory Visit: Payer: Self-pay

## 2023-09-16 DIAGNOSIS — Z049 Encounter for examination and observation for unspecified reason: Secondary | ICD-10-CM

## 2023-09-16 NOTE — Telephone Encounter (Signed)
Joe Hart from [facility] is calling on behalf of her dad. Joe Hart ordered an MRI lumbar. Joe Hart told them that he wanted Dr.Yarbrough's opinion if conservative treatment would be an option.  Joe Hart is aware that Dr.Yarbrough id out of the office until Thursday.    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 Last well-formed disc space labeled S1-S2.   1. Severe spinal canal stenosis at L5-S1 secondary to a combination of spondylolisthesis, disc bulge, and ligamentum flavum hypertrophy. There is redundancy of the cauda equina nerve roots above the L5-S1 level, suggestive of high grade spinal canal stenosis. 2. Severe bilateral neural foraminal narrowing at L4-L5, L5-S1, and S1-S2.

## 2023-09-17 NOTE — Telephone Encounter (Signed)
I spoke to Campbell. She will have Joe Hart refer her him to PT at [facility]. Once he completes PT she will call to schedule him an appt with Dr.Yarbrough.

## 2023-10-07 DIAGNOSIS — E119 Type 2 diabetes mellitus without complications: Secondary | ICD-10-CM | POA: Diagnosis not present

## 2023-10-07 DIAGNOSIS — H2513 Age-related nuclear cataract, bilateral: Secondary | ICD-10-CM | POA: Diagnosis not present

## 2023-10-07 DIAGNOSIS — H5203 Hypermetropia, bilateral: Secondary | ICD-10-CM | POA: Diagnosis not present

## 2023-10-07 DIAGNOSIS — H52223 Regular astigmatism, bilateral: Secondary | ICD-10-CM | POA: Diagnosis not present

## 2023-10-07 DIAGNOSIS — H35033 Hypertensive retinopathy, bilateral: Secondary | ICD-10-CM | POA: Diagnosis not present

## 2023-10-22 DIAGNOSIS — M5416 Radiculopathy, lumbar region: Secondary | ICD-10-CM | POA: Diagnosis not present

## 2023-10-22 DIAGNOSIS — M47816 Spondylosis without myelopathy or radiculopathy, lumbar region: Secondary | ICD-10-CM | POA: Diagnosis not present

## 2023-10-22 DIAGNOSIS — M6281 Muscle weakness (generalized): Secondary | ICD-10-CM | POA: Diagnosis not present

## 2023-10-22 DIAGNOSIS — M48062 Spinal stenosis, lumbar region with neurogenic claudication: Secondary | ICD-10-CM | POA: Diagnosis not present

## 2023-10-27 DIAGNOSIS — Z0130 Encounter for examination of blood pressure without abnormal findings: Secondary | ICD-10-CM | POA: Diagnosis not present

## 2023-10-27 DIAGNOSIS — E781 Pure hyperglyceridemia: Secondary | ICD-10-CM | POA: Diagnosis not present

## 2023-10-27 DIAGNOSIS — H9313 Tinnitus, bilateral: Secondary | ICD-10-CM | POA: Diagnosis not present

## 2023-10-27 DIAGNOSIS — Z1389 Encounter for screening for other disorder: Secondary | ICD-10-CM | POA: Diagnosis not present

## 2023-10-27 DIAGNOSIS — I10 Essential (primary) hypertension: Secondary | ICD-10-CM | POA: Diagnosis not present

## 2023-10-27 DIAGNOSIS — E119 Type 2 diabetes mellitus without complications: Secondary | ICD-10-CM | POA: Diagnosis not present

## 2023-10-27 DIAGNOSIS — Z712 Person consulting for explanation of examination or test findings: Secondary | ICD-10-CM | POA: Diagnosis not present

## 2023-10-27 DIAGNOSIS — E559 Vitamin D deficiency, unspecified: Secondary | ICD-10-CM | POA: Diagnosis not present

## 2023-11-10 DIAGNOSIS — H6983 Other specified disorders of Eustachian tube, bilateral: Secondary | ICD-10-CM | POA: Diagnosis not present

## 2023-11-10 DIAGNOSIS — H9313 Tinnitus, bilateral: Secondary | ICD-10-CM | POA: Diagnosis not present

## 2023-11-10 DIAGNOSIS — H906 Mixed conductive and sensorineural hearing loss, bilateral: Secondary | ICD-10-CM | POA: Diagnosis not present

## 2023-11-12 ENCOUNTER — Other Ambulatory Visit: Payer: Self-pay | Admitting: Otolaryngology

## 2023-11-12 ENCOUNTER — Encounter: Payer: Self-pay | Admitting: Otolaryngology

## 2023-11-12 NOTE — Discharge Instructions (Signed)
 [facility] DISCHARGE INSTRUCTIONS FOR MYRINGOTOMY AND TUBE INSERTION  [facility] AUSTIN ROSE, M.D.  Diet:   After surgery, the patient should take only liquids and foods as tolerated.  The patient may then have a regular diet after the effects of anesthesia have worn off, usually about four to six hours after surgery.  Activities:   The patient should rest until the effects of anesthesia have worn off.  After this, there are no restrictions on the normal daily activities.  Medications:   You will be given a prescription for antibiotic drops to be used in the ears postoperatively.  It is recommended to use 5 drops 2 times a day for 5 days, then the drops should be saved for possible future use.  The tubes should not cause any discomfort to the patient, but if there is any question, Tylenol should be given according to the instructions for the age of the patient.  Other medications should be continued normally.  Precautions:   Should there be recurrent drainage after the tubes are placed, the drops should be used for approximately 3-4 days.  If it does not clear, you should call the ENT office.  Earplugs:   Earplugs are only needed for those who are going to be submerged under water.  When taking a bath or shower and using a cup or showerhead to rinse hair, it is not necessary to wear earplugs.  These come in a variety of fashions, all of which can be obtained at our office.  However, if one is not able to come by the office, then silicone plugs can be found at most pharmacies.  It is not advised to stick anything in the ear that is not approved as an earplug.  Silly putty is not to be used as an earplug.  Swimming is allowed in patients after ear tubes are inserted, however, they must wear earplugs if they are going to be submerged under water.  For those children who are going to be swimming a lot, it is recommended to use a fitted ear mold, which can be made by  our audiologist.  If discharge is noticed from the ears, this most likely represents an ear infection.  We would recommend getting your eardrops and using them as indicated above.  If it does not clear, then you should call the ENT office.  For follow up, the patient should return to the ENT office three weeks postoperatively and then every six months as required by the doctor.

## 2023-11-16 ENCOUNTER — Other Ambulatory Visit: Payer: Self-pay

## 2023-11-16 MED ORDER — CIPROFLOXACIN-DEXAMETHASONE 0.3-0.1 % OT SUSP
4.0000 [drp] | Freq: Two times a day (BID) | OTIC | 0 refills | Status: AC
  Filled 2023-11-16: qty 7.5, 7d supply, fill #0

## 2023-11-19 ENCOUNTER — Ambulatory Visit: Payer: Self-pay | Admitting: Anesthesiology

## 2023-11-19 ENCOUNTER — Ambulatory Visit
Admission: RE | Admit: 2023-11-19 | Discharge: 2023-11-19 | Disposition: A | Discharge status: Home / Self Care | Attending: Otolaryngology | Admitting: Otolaryngology

## 2023-11-19 ENCOUNTER — Other Ambulatory Visit: Payer: Self-pay

## 2023-11-19 ENCOUNTER — Encounter
Admission: RE | Disposition: A | Discharge status: Home / Self Care | Payer: Self-pay | Source: Home / Self Care | Attending: Otolaryngology

## 2023-11-19 ENCOUNTER — Encounter: Payer: Self-pay | Admitting: Otolaryngology

## 2023-11-19 DIAGNOSIS — E119 Type 2 diabetes mellitus without complications: Secondary | ICD-10-CM | POA: Insufficient documentation

## 2023-11-19 DIAGNOSIS — I10 Essential (primary) hypertension: Secondary | ICD-10-CM | POA: Insufficient documentation

## 2023-11-19 DIAGNOSIS — Z87891 Personal history of nicotine dependence: Secondary | ICD-10-CM | POA: Insufficient documentation

## 2023-11-19 DIAGNOSIS — H6693 Otitis media, unspecified, bilateral: Secondary | ICD-10-CM | POA: Diagnosis not present

## 2023-11-19 DIAGNOSIS — H6983 Other specified disorders of Eustachian tube, bilateral: Secondary | ICD-10-CM | POA: Diagnosis not present

## 2023-11-19 DIAGNOSIS — H6523 Chronic serous otitis media, bilateral: Secondary | ICD-10-CM | POA: Diagnosis not present

## 2023-11-19 HISTORY — PX: MYRINGOTOMY WITH TUBE PLACEMENT: SHX5663

## 2023-11-19 SURGERY — MYRINGOTOMY WITH TUBE PLACEMENT
Anesthesia: General | Laterality: Bilateral

## 2023-11-19 MED ORDER — LIDOCAINE HCL (CARDIAC) PF 100 MG/5ML IV SOSY
PREFILLED_SYRINGE | INTRAVENOUS | Status: DC | PRN
  Administered 2023-11-19: 50 mg via INTRATRACHEAL

## 2023-11-19 MED ORDER — PROPOFOL 10 MG/ML IV BOLUS
INTRAVENOUS | Status: AC
  Filled 2023-11-19: qty 20

## 2023-11-19 MED ORDER — CIPROFLOXACIN-DEXAMETHASONE 0.3-0.1 % OT SUSP
OTIC | Status: DC | PRN
  Administered 2023-11-19: 4 [drp] via OTIC

## 2023-11-19 MED ORDER — LIDOCAINE HCL (PF) 2 % IJ SOLN
INTRAMUSCULAR | Status: AC
  Filled 2023-11-19: qty 5

## 2023-11-19 MED ORDER — ONDANSETRON HCL 4 MG/2ML IJ SOLN
INTRAMUSCULAR | Status: DC | PRN
  Administered 2023-11-19: 4 mg via INTRAVENOUS

## 2023-11-19 MED ORDER — CIPROFLOXACIN-DEXAMETHASONE 0.3-0.1 % OT SUSP
4.0000 [drp] | Freq: Two times a day (BID) | OTIC | Status: DC
Start: 2023-11-19 — End: 2023-11-19

## 2023-11-19 MED ORDER — MIDAZOLAM HCL 2 MG/2ML IJ SOLN
INTRAMUSCULAR | Status: AC
  Filled 2023-11-19: qty 2

## 2023-11-19 MED ORDER — EPHEDRINE SULFATE (PRESSORS) 50 MG/ML IJ SOLN
INTRAMUSCULAR | Status: DC | PRN
  Administered 2023-11-19 (×2): 5 mg via INTRAVENOUS

## 2023-11-19 MED ORDER — PROPOFOL 10 MG/ML IV BOLUS
INTRAVENOUS | Status: DC | PRN
  Administered 2023-11-19: 200 mg via INTRAVENOUS

## 2023-11-19 MED ORDER — MIDAZOLAM HCL 5 MG/5ML IJ SOLN
INTRAMUSCULAR | Status: DC | PRN
  Administered 2023-11-19: 2 mg via INTRAVENOUS

## 2023-11-19 SURGICAL SUPPLY — 10 items
BLADE MYR LANCE NRW W/HDL (BLADE) ×1 IMPLANT
CANISTER SUCT 1200ML W/VALVE (MISCELLANEOUS) ×1 IMPLANT
COTTONBALL LRG STERILE PKG (GAUZE/BANDAGES/DRESSINGS) ×1 IMPLANT
GAUZE SPONGE 4X4 12PLY STRL (GAUZE/BANDAGES/DRESSINGS) ×1 IMPLANT
GLOVE PI ULTRA LF STRL 7.5 (GLOVE) ×1 IMPLANT
STRAP BODY AND KNEE 60X3 (MISCELLANEOUS) ×1 IMPLANT
TOWEL OR 17X26 4PK STRL BLUE (TOWEL DISPOSABLE) ×1 IMPLANT
TUBE EAR T 1.27X5.3 BFLY (OTOLOGIC RELATED) IMPLANT
TUBE GRMT FLRPLST BEV 1.14 (OTOLOGIC RELATED) ×1 IMPLANT
TUBING SUCTION CONN 0.25 STRL (TUBING) ×1 IMPLANT

## 2023-11-19 NOTE — Anesthesia Procedure Notes (Signed)
 Procedure Name: LMA Insertion Date/Time: 11/19/2023 12:00 PM  Performed by: Domenic Moras, CRNAPre-anesthesia Checklist: Patient identified, Emergency Drugs available, Suction available and Patient being monitored Patient Re-evaluated:Patient Re-evaluated prior to induction Oxygen Delivery Method: Circle system utilized Preoxygenation: Pre-oxygenation with 100% oxygen Induction Type: IV induction Ventilation: Mask ventilation without difficulty LMA: LMA inserted LMA Size: 4.0 Tube size: 4.0 mm Number of attempts: 1 Placement Confirmation: positive ETCO2 and breath sounds checked- equal and bilateral Tube secured with: Tape Dental Injury: Teeth and Oropharynx as per pre-operative assessment

## 2023-11-19 NOTE — Transfer of Care (Signed)
 Immediate Anesthesia Transfer of Care Note  Patient: Joe Hart  Procedure(s) Performed: MYRINGOTOMY WITH TUBE PLACEMENT (Bilateral)  Patient Location: PACU  Anesthesia Type: General  Level of Consciousness: awake, alert  and patient cooperative  Airway and Oxygen Therapy: Patient Spontanous Breathing and Patient connected to supplemental oxygen  Post-op Assessment: Post-op Vital signs reviewed, Patient's Cardiovascular Status Stable, Respiratory Function Stable, Patent Airway and No signs of Nausea or vomiting  Post-op Vital Signs: Reviewed and stable  Complications: No notable events documented.

## 2023-11-19 NOTE — Op Note (Signed)
 OPERATIVE REPORT  Attending Physician: Magnus Ivan. Okey Dupre, MD, MBA, FARS    Pediatric Otoloaryngology      Preoperative Diagnosis: Recurrent otitis media.  Postoperative Diagnosis: Same.   Procedure(s) Performed:   1. Tympanostomy tube insertion  (CPT [id number]) - bilateral butterfly PE tubes 2. Use of operating microscope (CPT [phone])   Teaching Surgeon:  Magnus Ivan. Okey Dupre, MD, MBA, FARS Assistants: None Dictated   Anesthesia:  General with mask ventilation Specimens:  None Drains:  Bilateral PE tubes Estimated Blood Loss:  None  Operative Findings: Right ear:  retraction of tympanic membrane and amber effusion Left ear:  retraction of tympanic membrane and amber effusion  Procedure: After informed consent was obtained from the patient's parents, the patient was brought from the preoperative holding area to the operating room and placed supine on the operating room table. After smooth induction of general anesthesia with mask ventilation, a timeout was called and all parties were in agreement. The operating microscope was then brought into the field. A speculum was then used to visualize the right external auditory canal. Cerumen was removed and the tympanic membrane was ultimately visualized. A myringotomy was made in the anterior inferior portion of the tympanic membrane using a myringotomy knife.  Any middle ear fluid / effusion was removed with suction.  A pressure equalization tube was carefully placed and positioned with a pick. Floxin drops were instilled and a cotton swab was used for occlusion. Attention was then turned to completion of the procedure on the contralateral side, which was done in a similar fashion. A speculum was then used to visualize the external auditory canal. Cerumen was removed. The tympanic membrane was ultimately visualized and a myringotomy was made in the anterior inferior portion of the tympanic membrane.  Any middle ear fluid / effusion was removed with suction.  A  pressure equalization tube was carefully placed on the left and positioned with a pick. Ciprofloxacin drops were then instilled and a cotton ball placed.  The patient's care was turned over to the Anesthesia team who successfully awakened the patient without event. The patient was transported to the PACU in stable condition. All instrument, sharp and lap counts were correct at the end of the case.   Teaching Surgeon Attestation:  I was present and performed the entire procedure.    Magnus Ivan. Okey Dupre, MD, MBA, [facility] [phone]

## 2023-11-19 NOTE — Anesthesia Postprocedure Evaluation (Signed)
 Anesthesia Post Note  Patient: Joe Hart  Procedure(s) Performed: MYRINGOTOMY WITH TUBE PLACEMENT (Bilateral)  Patient location during evaluation: PACU Anesthesia Type: General Level of consciousness: awake and alert Pain management: pain level controlled Vital Signs Assessment: post-procedure vital signs reviewed and stable Respiratory status: spontaneous breathing, nonlabored ventilation, respiratory function stable and patient connected to nasal cannula oxygen Cardiovascular status: blood pressure returned to baseline and stable Postop Assessment: no apparent nausea or vomiting Anesthetic complications: no   No notable events documented.   Last Vitals:  Vitals:   11/19/23 1245 11/19/23 1253  BP: 109/71 110/77  Pulse: 71 63  Resp: 20 (!) 22  Temp:  (!) 36.3 C  SpO2: 97% 95%    Last Pain:  Vitals:   11/19/23 1253  TempSrc:   PainSc: 0-No pain                 Roshanna Cimino C Javarus Dorner

## 2023-11-19 NOTE — Interval H&P Note (Signed)
 History and Physical Interval Note:  11/19/2023 12:02 PM  Joe Hart  has presented today for surgery, with the diagnosis of EUSTACHIAN TUBE DYSFUNCTION, CHRONIC OTITIS MEDIA.  The various methods of treatment have been discussed with the patient and family. After consideration of risks, benefits and other options for treatment, the patient has consented to  Procedure(Hart) with comments: MYRINGOTOMY WITH TUBE PLACEMENT (Bilateral) - BUTTERFLY TUBES as a surgical intervention.  The patient'Hart history has been reviewed, patient examined, no change in status, stable for surgery.  I have reviewed the patient'Hart chart and labs.  Questions were answered to the patient'Hart satisfaction.     Joe Hart  No changes to H&P.   Magnus Ivan. Okey Dupre, MD, MBA, [facility] [place] [phone]

## 2023-11-19 NOTE — Anesthesia Preprocedure Evaluation (Addendum)
 Anesthesia Evaluation  Patient identified by MRN, date of birth, ID band Patient awake    Reviewed: Allergy & Precautions, H&P , NPO status , Patient's Chart, lab work & pertinent test results  Airway Mallampati: III  TM Distance: >3 FB Neck ROM: Full    Dental no notable dental hx. (+) Lower Dentures, Upper Dentures   Pulmonary former smoker   Pulmonary exam normal breath sounds clear to auscultation       Cardiovascular hypertension, Normal cardiovascular exam Rhythm:Regular Rate:Normal     Neuro/Psych negative neurological ROS  negative psych ROS   GI/Hepatic negative GI ROS, Neg liver ROS,,,  Endo/Other  diabetes    Renal/GU negative Renal ROS  negative genitourinary   Musculoskeletal negative musculoskeletal ROS (+)    Abdominal   Peds negative pediatric ROS (+)  Hematology negative hematology ROS (+)   Anesthesia Other Findings HTN Diabetes mellitus Hx uvula removed, and "everything up there" for sleep apnea, and no longer has sleep apnea, not sure if jaw advanced or not.   Reproductive/Obstetrics negative OB ROS                             Anesthesia Physical Anesthesia Plan  ASA: 3  Anesthesia Plan: General   Post-op Pain Management:    Induction: Intravenous  PONV Risk Score and Plan:   Airway Management Planned: LMA  Additional Equipment:   Intra-op Plan:   Post-operative Plan: Extubation in OR  Informed Consent: I have reviewed the patients History and Physical, chart, labs and discussed the procedure including the risks, benefits and alternatives for the proposed anesthesia with the patient or authorized representative who has indicated his/her understanding and acceptance.     Dental Advisory Given  Plan Discussed with: Anesthesiologist, CRNA and Surgeon  Anesthesia Plan Comments: (Patient consented for risks of anesthesia including but not limited to:   - adverse reactions to medications - damage to eyes, teeth, lips or other oral mucosa - nerve damage due to positioning  - sore throat or hoarseness - Damage to heart, brain, nerves, lungs, other parts of body or loss of life  Patient voiced understanding and assent.)        Anesthesia Quick Evaluation

## 2023-12-11 DIAGNOSIS — H663X3 Other chronic suppurative otitis media, bilateral: Secondary | ICD-10-CM | POA: Diagnosis not present

## 2023-12-11 DIAGNOSIS — H903 Sensorineural hearing loss, bilateral: Secondary | ICD-10-CM | POA: Diagnosis not present

## 2024-02-26 DIAGNOSIS — E781 Pure hyperglyceridemia: Secondary | ICD-10-CM | POA: Diagnosis not present

## 2024-02-26 DIAGNOSIS — I10 Essential (primary) hypertension: Secondary | ICD-10-CM | POA: Diagnosis not present

## 2024-02-26 DIAGNOSIS — E559 Vitamin D deficiency, unspecified: Secondary | ICD-10-CM | POA: Diagnosis not present

## 2024-02-26 DIAGNOSIS — E119 Type 2 diabetes mellitus without complications: Secondary | ICD-10-CM | POA: Diagnosis not present

## 2024-02-29 DIAGNOSIS — M48062 Spinal stenosis, lumbar region with neurogenic claudication: Secondary | ICD-10-CM | POA: Diagnosis not present

## 2024-02-29 DIAGNOSIS — M5416 Radiculopathy, lumbar region: Secondary | ICD-10-CM | POA: Diagnosis not present

## 2024-03-08 ENCOUNTER — Other Ambulatory Visit (HOSPITAL_COMMUNITY): Payer: Self-pay

## 2024-03-22 DIAGNOSIS — Z0130 Encounter for examination of blood pressure without abnormal findings: Secondary | ICD-10-CM | POA: Diagnosis not present

## 2024-03-22 DIAGNOSIS — E119 Type 2 diabetes mellitus without complications: Secondary | ICD-10-CM | POA: Diagnosis not present

## 2024-03-22 DIAGNOSIS — Z0131 Encounter for examination of blood pressure with abnormal findings: Secondary | ICD-10-CM | POA: Diagnosis not present

## 2024-03-22 DIAGNOSIS — I10 Essential (primary) hypertension: Secondary | ICD-10-CM | POA: Diagnosis not present

## 2024-03-22 DIAGNOSIS — Z1389 Encounter for screening for other disorder: Secondary | ICD-10-CM | POA: Diagnosis not present

## 2024-03-22 DIAGNOSIS — E781 Pure hyperglyceridemia: Secondary | ICD-10-CM | POA: Diagnosis not present

## 2024-03-22 DIAGNOSIS — Z712 Person consulting for explanation of examination or test findings: Secondary | ICD-10-CM | POA: Diagnosis not present

## 2024-03-24 DIAGNOSIS — M48062 Spinal stenosis, lumbar region with neurogenic claudication: Secondary | ICD-10-CM | POA: Diagnosis not present

## 2024-03-24 DIAGNOSIS — M5416 Radiculopathy, lumbar region: Secondary | ICD-10-CM | POA: Diagnosis not present

## 2024-04-14 NOTE — Progress Notes (Signed)
 HPI The patient is a 68 year-old gentleman who presents today for follow-up of acute on chronic left buttock pain with radiation into the posterior thigh and posterior calf.  His symptoms began around 2023 without preceding injury or trauma.  He describes pain that is usually most troublesome with prolonged sitting (limited to 45 minutes) and driving.  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  Driving to get to the sites is often the most painful aspect.  He usually does better if he can get up and move around.  He has undergone physical therapy at [facility] (last visit 10/22/2023).  PT was not beneficial.  He continues with home exercises.  Medications have included Medrol Dosepak x 2 separated by 10 days (first Dosepak: No relief, second Dosepak: Good relief for several months), ibuprofen 800 mg daily as needed breakthrough pain (moderate relief), and tizanidine 4 mg nightly (sedation, minimal relief).  He is hopeful to avoid surgery if at all possible.  He has diabetes mellitus.  He reports hemoglobin A1c in July 2025 was 7.3.  He was initially evaluated on 02/29/2024 at which time he continued with ibuprofen 800 mg daily as needed and was scheduled for a left S1 versus 2 transforaminal ESI.  Today he reports good relief lasting for 1 week and thereafter had return of his symptoms.  He has noted to lean off to the right side and has pain at the left butt bone.  If he sits on a hard surface he has severe point tenderness localized to the ischial tuberosity.  He has severe tenderness to palpation.  We discussed options and he would like to try a left ischial bursa injection under fluoroscopy.  I treat his wife Marval.  The [place]  Narcotic Database was reviewed today.   Procedures: 03/24/2024: Left S2 transforaminal ESI (good relief x 1 week)   Past Medical History:  Diagnosis Date  . Diabetes mellitus type 2, uncomplicated (CMS/HHS-HCC)   . Hypertension      Past Surgical History:  Procedure Laterality Date  . lipoma excision  04/22/2016  . HERNIA REPAIR    . UVULOPALATOPLASTY      Social History   Socioeconomic History  . Marital status: Married  Tobacco Use  . Smoking status: Every Day  . Smokeless tobacco: Never  Substance and Sexual Activity  . Alcohol use: No   Social Drivers of Corporate investment banker Strain: Medium Risk (02/29/2024)   Overall Financial Resource Strain (CARDIA)   . Difficulty of Paying Living Expenses: Somewhat hard  Food Insecurity: Food Insecurity Present (02/29/2024)   Hunger Vital Sign   . Worried About Programme researcher, broadcasting/film/video in the Last Year: Sometimes true   . Ran Out of Food in the Last Year: Sometimes true  Transportation Needs: No Transportation Needs (02/29/2024)   PRAPARE - Transportation   . Lack of Transportation (Medical): No   . Lack of Transportation (Non-Medical): No    Family History  Problem Relation Name Age of Onset  . Diabetes type II Mother    . High blood pressure (Hypertension) Mother      Current Outpatient Medications on File Prior to Visit  Medication Sig Dispense Refill  . amLODIPine (NORVASC) 10 MG tablet Take 10 mg by mouth once daily.    SABRA atorvastatin (LIPITOR) 20 MG tablet Take 20 mg by mouth once daily.    . ciprofloxacin -dexAMETHasone  (CIPRODEX ) otic suspension Place 4 drops in ear(s)    .  dutasteride-tamsulosin (JALYN) 0.5-0.4 mg capsule     . finasteride (PROSCAR) 5 mg tablet TAKE ONE TABLET BY MOUTH ONCE DAILY FOR ENLARGED PROSTATE    . FLUoxetine (PROZAC) 20 MG tablet Take 20 mg by mouth once daily.    SABRA FLUoxetine (PROZAC) 40 MG capsule TAKE ONE CAPSULE BY MOUTH ONCE DAILY FOR ANXIETY    . gemfibrozil (LOPID) 600 mg tablet Take 600 mg by mouth once daily.    SABRA glipiZIDE (GLUCOTROL) 5 MG tablet Take 5 mg by mouth 2 (two) times daily.    . hydrALAZINE (APRESOLINE) 25 MG tablet Take 25 mg by mouth 2 (two) times daily.    . hydroCHLOROthiazide (HYDRODIURIL)  25 MG tablet Take 25 mg by mouth once daily.    SABRA linaGLIPtin (TRADJENTA) 5 mg tablet Take 5 mg by mouth once daily.    SABRA lisinopriL (ZESTRIL) 40 MG tablet Take 40 mg by mouth once daily for Blood Pressure    . magnesium oxide (MAG-OX) 400 mg (241.3 mg magnesium) tablet TAKE ONE TABLET BY MOUTH TWICE DAILY FOR MUSCLE CRAMPS    . metFORMIN (GLUCOPHAGE) 1000 MG tablet TAKE ONE TABLET BY MOUTH TWICE DAILY FOR DIABETES    . tamsulosin (FLOMAX) 0.4 mg capsule TAKE ONE CAPSULE BY MOUTH ONCE DAILY 30 MINUTES AFTER SAME MEAL    . tiZANidine (ZANAFLEX) 4 MG capsule Take 4 mg by mouth 3 (three) times daily    . TRILIPIX 135 mg DR capsule     . finasteride (PROSCAR) 5 mg tablet Take 5 mg by mouth once daily    . fluticasone propionate (FLONASE) 50 mcg/actuation nasal spray     . HYDROcodone -acetaminophen  (NORCO) 5-325 mg tablet Take 1 tablet by mouth every 4 (four) hours as needed for Pain. 30 tablet 0  . ibuprofen (MOTRIN) 800 MG tablet Take 1 tablet (800 mg total) by mouth every 6 (six) hours as needed for Pain 60 tablet 0  . metFORMIN (GLUCOPHAGE) 500 MG tablet Take 500 mg by mouth 2 (two) times daily with meals.    . quinapril (ACCUPRIL) 20 MG tablet Take 20 mg by mouth 3 (three) times a day.    . quinapriL (ACCUPRIL) 40 MG tablet Take 120 mg by mouth once daily    . tiZANidine (ZANAFLEX) 4 MG tablet Take 1 tablet (4 mg total) by mouth 2 (two) times daily as needed for Muscle spasms 60 tablet 0   No current facility-administered medications on file prior to visit.    Allergies as of 04/14/2024 - Reviewed 04/14/2024  Allergen Reaction Noted  . Atenolol Other (See Comments) 03/08/2010    ROS More than 10 system, review of system form was given to the patient to fill out and has been signed by Dr. Avanell and scanned into the patient's chart.   Vital signs Vitals:   04/14/24 1309  BP: 118/68  Pulse: 82  Temp: 36.4 C (97.5 F)  TempSrc: Oral  Weight: 85.3 kg (188 lb)  Height: 172.7 cm (5'  7.99)  PainSc:   5  PainLoc: Back    Exam Cervical Exam (performed )   Upper Extremity Exam   Lumbosacral Exam (performed 02/29/24) On inspection no scar or rash is noted.  On palpation he has minimal tenderness of the lumbosacral paraspinal musculature.  Lumbar extension with rotation produces mild lumbosacral pain.  Lower Extremity Exam He has 5/5 strength of bilateral dorsiflexors, knee extensors, and hip flexors.  He is able to heel and toe walk without difficulty.  Sensation light touch is intact throughout bilateral lower extremities.  He has symmetrical 2+ patellar and trace Achilles reflexes.  He does not have ankle clonus.  Straight leg raise is negative bilaterally.  Range of motion of the hip joints does not produce pain.   Radiographic Data MRI of the lumbar spine dated 09/02/2023 both images and report were reviewed.  There is transitional anatomy with lumbarization of the S1 segment.  With this numbering system at L5-S1 there is severe central stenosis.  At S1-2 there is severe (left greater than right) bilateral foraminal stenosis.   Impression 1.  Acute on chronic left buttock pain with radiation into the posterior thigh and posterior calf.  Clinically has symptoms consistent with a lumbosacral radiculitis.  MRI lumbar spine dated 09/02/2023 imaging and report reviewed today.  TECHNIQUE:  Multiplanar, multisequence MR imaging of the lumbar spine was  performed. No intravenous contrast was administered.  COMPARISON:  None Available.  FINDINGS:  Segmentation: Transitional anatomy. There is lumbarization of the S1  with last well-formed disc space labeled S1-S2.  Alignment: Mild retrolisthesis of L3 on L4 and grade 1  anterolisthesis of L5 on S1.  Vertebrae: No fracture, evidence of discitis, or bone lesion.  Degenerative endplate changes of the inferior and superior endplates  of L3 and L4.  Conus medullaris and cauda equina: Conus extends to the L2-L3 disc  space  level. There is redundancy of the cauda equina nerve roots  above the L5-S1 level, suggestive of high grade spinal canal  stenosis.  Paraspinal and other soft tissues: Negative.   Disc levels:  T12-L1: Only imaged in the sagittal plane. Minimal disc bulge. No  evidence of high-grade spinal canal stenosis. Mild bilateral neural  foraminal stenosis.  L1-L2: Mild bilateral facet degenerative change. Minimal disc bulge.  No spinal canal narrowing. No neural foraminal narrowing.  L2-L3: Circumferential disc bulge. Mild bilateral facet degenerative  change. Mild spinal canal narrowing. Mild bilateral neural foraminal  narrowing.  L3-L4: Mild bilateral facet degenerative change. Eccentric left disc  bulge. There is narrowing of the bilateral lateral recesses.  Moderate to severe right and moderate left neural foraminal  narrowing.  L4-L5: Mild bilateral facet degenerative change. Circumferential  disc bulge. Mild overall spinal canal narrowing. There is narrowing  of the bilateral lateral recesses. Severe bilateral neural foraminal  narrowing, right-greater-than-left.  L5-S1: Grade 1 anterolisthesis. Severe bilateral facet degenerative  change. Circumferential disc bulge. Severe spinal canal stenosis.  Severe bilateral neural foraminal narrowing.  S1-S2: Mild bilateral facet degenerative change. Circumferential  disc bulge. Mild spinal canal narrowing. Severe bilateral neural  foraminal narrowing, left-greater-than-right.   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 Last well-formed  disc space labeled S1-S2.  1. Severe spinal canal stenosis at L5-S1 secondary to a combination  of spondylolisthesis, disc bulge, and ligamentum flavum hypertrophy.  There is redundancy of the cauda equina nerve roots above the L5-S1  level, suggestive of high grade spinal canal stenosis.  2. Severe bilateral neural foraminal narrowing at L4-L5, L5-S1, and  S1-S2.  Electronically  Signed    By: Lyndall Gore M.D.    On: 09/11/2023 07:59   2.  Left ischial bursitis.  3.  Diabetes mellitus.  He reports hemoglobin A1c in July 2025 was 7.3.  4.  Hypertension.   Plan 1.  Continue ibuprofen 800 mg daily as needed breakthrough pain. 2.  He has been scheduled for a left ischial bursitis injection under fluoroscopy. 3.  Continue home exercise program  as learned in physical therapy. 4.  He will follow-up with me 3 to 4 weeks after the injection.   Social Drivers of Health with Concerns   Tobacco Use: High Risk (04/14/2024)   Patient History   . Smoking Tobacco Use: Every Day   . Smokeless Tobacco Use: Never  Financial Resource Strain: Medium Risk (02/29/2024)   Overall Financial Resource Strain (CARDIA)   . Difficulty of Paying Living Expenses: Somewhat hard  Food Insecurity: Food Insecurity Present (02/29/2024)   Hunger Vital Sign   . Worried About Programme researcher, broadcasting/film/video in the Last Year: Sometimes true   . Ran Out of Food in the Last Year: Sometimes true  Housing Stability: Unknown (02/29/2024)   Housing Stability Vital Sign   . Unable to Pay for Housing in the Last Year: No   . Homeless in the Last Year: No      Joe CHARLES CHASNIS, DO  This note was generated in part with voice recognition software and I apologize for any typographical errors that were not detected and corrected.  PCP:  [facility]

## 2024-04-23 ENCOUNTER — Other Ambulatory Visit: Payer: Self-pay

## 2024-04-23 ENCOUNTER — Emergency Department
Admission: EM | Admit: 2024-04-23 | Discharge: 2024-04-23 | Disposition: A | Discharge status: Home / Self Care | Source: Ambulatory Visit | Attending: Emergency Medicine | Admitting: Emergency Medicine

## 2024-04-23 ENCOUNTER — Emergency Department

## 2024-04-23 DIAGNOSIS — R0789 Other chest pain: Secondary | ICD-10-CM

## 2024-04-23 DIAGNOSIS — E119 Type 2 diabetes mellitus without complications: Secondary | ICD-10-CM | POA: Insufficient documentation

## 2024-04-23 DIAGNOSIS — I10 Essential (primary) hypertension: Secondary | ICD-10-CM | POA: Insufficient documentation

## 2024-04-23 DIAGNOSIS — R079 Chest pain, unspecified: Secondary | ICD-10-CM | POA: Diagnosis present

## 2024-04-23 LAB — CBC
HCT: 36.9 % — ABNORMAL LOW (ref 39.0–52.0)
Hemoglobin: 12.5 g/dL — ABNORMAL LOW (ref 13.0–17.0)
MCH: 31.7 pg (ref 26.0–34.0)
MCHC: 33.9 g/dL (ref 30.0–36.0)
MCV: 93.7 fL (ref 80.0–100.0)
Platelets: 287 K/uL (ref 150–400)
RBC: 3.94 MIL/uL — ABNORMAL LOW (ref 4.22–5.81)
RDW: 12.8 % (ref 11.5–15.5)
WBC: 11.3 K/uL — ABNORMAL HIGH (ref 4.0–10.5)
nRBC: 0 % (ref 0.0–0.2)

## 2024-04-23 LAB — BASIC METABOLIC PANEL WITH GFR
Anion gap: 10 (ref 5–15)
BUN: 26 mg/dL — ABNORMAL HIGH (ref 8–23)
CO2: 25 mmol/L (ref 22–32)
Calcium: 9.3 mg/dL (ref 8.9–10.3)
Chloride: 103 mmol/L (ref 98–111)
Creatinine, Ser: 1.19 mg/dL (ref 0.61–1.24)
GFR, Estimated: >60 mL/min (ref >60)
Glucose, Bld: 135 mg/dL — ABNORMAL HIGH (ref 70–99)
Potassium: 3.7 mmol/L (ref 3.5–5.1)
Sodium: 138 mmol/L (ref 135–145)

## 2024-04-23 LAB — TROPONIN I (HIGH SENSITIVITY): Troponin I (High Sensitivity): 4 ng/L (ref <18)

## 2024-04-23 NOTE — ED Provider Notes (Signed)
 [facility] Provider Note    Event Date/Time   First MD Initiated Contact with Patient 04/23/24 0945     (approximate)   History   Chest Pain   HPI  Joe Hart is a 68 y.o. male with a history of hypertension and diabetes who presents with chest pain, mainly in the left lower part of his chest, constant but worse with certain positions or with a deep breath.  The patient states that he had a cold a few weeks ago and was coughing a lot.  The pain has gradually risen since then.  He states that the pain is worse when he lies on the left side.  He denies any shortness of breath.  He does not feel dizzy or lightheaded.  He has no nausea or vomiting.  He has no leg swelling.  He denies any trauma to the chest.  I reviewed the past medical records.  The patient's most recent outpatient encounter was her [facility] on 8/28 for follow-up of chronic left buttock pain.   Physical Exam   Triage Vital Signs: ED Triage Vitals  Encounter Vitals Group     BP 04/23/24 0914 131/73     Girls Systolic BP Percentile --      Girls Diastolic BP Percentile --      Boys Systolic BP Percentile --      Boys Diastolic BP Percentile --      Pulse Rate 04/23/24 0914 73     Resp 04/23/24 0914 16     Temp 04/23/24 0914 97.7 F (36.5 C)     Temp Source 04/23/24 0914 Oral     SpO2 04/23/24 0914 100 %     Weight 04/23/24 0911 177 lb (80.3 kg)     Height 04/23/24 0911 5' 8 (1.727 m)     Head Circumference --      Peak Flow --      Pain Score 04/23/24 0921 3     Pain Loc --      Pain Education --      Exclude from Growth Chart --     Most recent vital signs: Vitals:   04/23/24 0949 04/23/24 1000  BP:  114/65  Pulse:  65  Resp:  18  Temp:    SpO2: 100% 98%    General: Alert, well-appearing, no distress.  CV:  Good peripheral perfusion.  Normal heart sounds. Resp:  Normal effort.  Lungs CTAB.   Abd:  No distention.  Other:  Mild left lower rib border  tenderness.   ED Results / Procedures / Treatments   Labs (all labs ordered are listed, but only abnormal results are displayed) Labs Reviewed  BASIC METABOLIC PANEL WITH GFR - Abnormal; Notable for the following components:      Result Value   Glucose, Bld 135 (*)    BUN 26 (*)    All other components within normal limits  CBC - Abnormal; Notable for the following components:   WBC 11.3 (*)    RBC 3.94 (*)    Hemoglobin 12.5 (*)    HCT 36.9 (*)    All other components within normal limits  TROPONIN I (HIGH SENSITIVITY)     EKG  ED ECG REPORT I, Waylon Cassis, the attending physician, personally viewed and interpreted this ECG.  Date: 04/23/2024 EKG Time: 0917 Rate: 81 Rhythm: normal sinus rhythm QRS Axis: normal Intervals: normal ST/T Wave abnormalities: Nonspecific T wave abnormality Narrative Interpretation: no evidence  of acute ischemia    RADIOLOGY  Chest x-ray: I independently viewed and interpreted the images; there is no focal consolidation or edema  PROCEDURES:  Critical Care performed: No  Procedures   MEDICATIONS ORDERED IN ED: Medications - No data to display   IMPRESSION / MDM / ASSESSMENT AND PLAN / ED COURSE  I reviewed the triage vital signs and the nursing notes.  68 year old male with PMH as noted above presents with atypical and somewhat positional left sided chest pain, worsening since he had a cold a few weeks ago.  On exam his vital signs are normal.  He is well-appearing.  Physical exam is unremarkable for acute findings.  EKG is nonischemic.  Differential diagnosis includes, but is not limited to, muscle spasm or strain likely related to coughing, rib fracture, musculoskeletal pain, pneumonia, bronchitis, less likely ACS.  There is no clinical evidence for aortic dissection or other vascular etiology.  The presentation is also not consistent with PE.  Although it is somewhat pleuritic, he has no DVT symptoms, no tachycardia or  hypoxia, and somewhat reproducible positional pain.  We will obtain chest x-ray, cardiac enzymes, and reassess.  Patient's presentation is most consistent with acute complicated illness / injury requiring diagnostic workup.  The patient is on the cardiac monitor to evaluate for evidence of arrhythmia and/or significant heart rate changes.   ----------------------------------------- 11:31 AM on 04/23/2024 -----------------------------------------  Chest x-ray is clear.  Troponin is negative.  Given the duration of the symptoms there is no indication for a repeat.  BMP and CBC show no acute findings.  On reassessment the patient continues to appear comfortable.  Presentation is most consistent with muscle strain or spasm of the chest wall.  The patient is stable for discharge home at this time.  Return precautions given, and he expressed understanding.   FINAL CLINICAL IMPRESSION(S) / ED DIAGNOSES   Final diagnoses:  Musculoskeletal chest pain     Rx / DC Orders   ED Discharge Orders     None        Note:  This document was prepared using Dragon voice recognition software and may include unintentional dictation errors.    Jacolyn Pae, MD 04/23/24 1131

## 2024-04-23 NOTE — ED Notes (Signed)
 First nurse note: From [facility] for L chest pain intermittent since 1 week. Also has had cough since 1 week. KC VS: 127/77, HR 85, 97% on RA, 97.9.

## 2024-04-23 NOTE — ED Triage Notes (Signed)
 Pt to ED via POV for left-sided chest pain x 1 week. States he had a cold about 3 weeks ago and ever since he got over the cold, he's had the left-sided chest pain. States he has some congestion - feels like it's stuck in there in the left side and I can't get it out. Pt states the pain feels like a pulled muscle. Denies SOB, N/V.

## 2024-04-23 NOTE — Progress Notes (Signed)
 Patient presents to triage with complaints of respiratory symptoms for 2 weeks, now also with left-sided chest pain.  Patient with complicated past medical history.  To ED for further evaluation and care.

## 2024-05-18 ENCOUNTER — Ambulatory Visit

## 2024-05-18 ENCOUNTER — Ambulatory Visit
Admission: RE | Admit: 2024-05-18 | Discharge: 2024-05-18 | Disposition: A | Discharge status: Home / Self Care | Attending: Internal Medicine | Admitting: Internal Medicine

## 2024-05-18 ENCOUNTER — Other Ambulatory Visit: Payer: Self-pay

## 2024-05-18 ENCOUNTER — Other Ambulatory Visit: Payer: Self-pay | Admitting: Internal Medicine

## 2024-05-18 ENCOUNTER — Encounter: Payer: Self-pay | Admitting: Internal Medicine

## 2024-05-18 ENCOUNTER — Encounter
Admission: RE | Disposition: A | Discharge status: Home / Self Care | Payer: Self-pay | Source: Home / Self Care | Attending: Internal Medicine

## 2024-05-18 DIAGNOSIS — E119 Type 2 diabetes mellitus without complications: Secondary | ICD-10-CM | POA: Insufficient documentation

## 2024-05-18 DIAGNOSIS — Z87891 Personal history of nicotine dependence: Secondary | ICD-10-CM | POA: Diagnosis not present

## 2024-05-18 DIAGNOSIS — Z7984 Long term (current) use of oral hypoglycemic drugs: Secondary | ICD-10-CM | POA: Diagnosis not present

## 2024-05-18 DIAGNOSIS — R1013 Epigastric pain: Secondary | ICD-10-CM | POA: Diagnosis present

## 2024-05-18 DIAGNOSIS — J449 Chronic obstructive pulmonary disease, unspecified: Secondary | ICD-10-CM | POA: Diagnosis not present

## 2024-05-18 DIAGNOSIS — R140 Abdominal distension (gaseous): Secondary | ICD-10-CM | POA: Insufficient documentation

## 2024-05-18 DIAGNOSIS — Z7982 Long term (current) use of aspirin: Secondary | ICD-10-CM | POA: Diagnosis not present

## 2024-05-18 DIAGNOSIS — I10 Essential (primary) hypertension: Secondary | ICD-10-CM | POA: Insufficient documentation

## 2024-05-18 HISTORY — PX: ESOPHAGOGASTRODUODENOSCOPY: SHX5428

## 2024-05-18 LAB — GLUCOSE, CAPILLARY: Glucose-Capillary: 103 mg/dL — ABNORMAL HIGH (ref 70–99)

## 2024-05-18 SURGERY — EGD (ESOPHAGOGASTRODUODENOSCOPY)
Anesthesia: General

## 2024-05-18 MED ORDER — PHENYLEPHRINE 80 MCG/ML (10ML) SYRINGE FOR IV PUSH (FOR BLOOD PRESSURE SUPPORT)
PREFILLED_SYRINGE | INTRAVENOUS | Status: AC
  Filled 2024-05-18: qty 10

## 2024-05-18 MED ORDER — GLYCOPYRROLATE 0.2 MG/ML IJ SOLN
INTRAMUSCULAR | Status: DC | PRN
  Administered 2024-05-18: .2 mg via INTRAVENOUS

## 2024-05-18 MED ORDER — EPHEDRINE 5 MG/ML INJ
INTRAVENOUS | Status: AC
  Filled 2024-05-18: qty 5

## 2024-05-18 MED ORDER — LIDOCAINE HCL (PF) 2 % IJ SOLN
INTRAMUSCULAR | Status: AC
  Filled 2024-05-18: qty 5

## 2024-05-18 MED ORDER — LIDOCAINE HCL (CARDIAC) PF 100 MG/5ML IV SOSY
PREFILLED_SYRINGE | INTRAVENOUS | Status: DC | PRN
  Administered 2024-05-18: 80 mg via INTRAVENOUS

## 2024-05-18 MED ORDER — PROPOFOL 1000 MG/100ML IV EMUL
INTRAVENOUS | Status: AC
  Filled 2024-05-18: qty 100

## 2024-05-18 MED ORDER — GLYCOPYRROLATE 0.2 MG/ML IJ SOLN
INTRAMUSCULAR | Status: AC
  Filled 2024-05-18: qty 1

## 2024-05-18 MED ORDER — PROPOFOL 500 MG/50ML IV EMUL
INTRAVENOUS | Status: DC | PRN
  Administered 2024-05-18: 100 mg via INTRAVENOUS
  Administered 2024-05-18: 150 ug/kg/min via INTRAVENOUS

## 2024-05-18 MED ORDER — ROCURONIUM BROMIDE 10 MG/ML (PF) SYRINGE
PREFILLED_SYRINGE | INTRAVENOUS | Status: AC
  Filled 2024-05-18: qty 10

## 2024-05-18 MED ORDER — SODIUM CHLORIDE 0.9 % IV SOLN: INTRAVENOUS | Status: DC

## 2024-05-18 MED ORDER — SUCCINYLCHOLINE CHLORIDE 200 MG/10ML IV SOSY
PREFILLED_SYRINGE | INTRAVENOUS | Status: AC
  Filled 2024-05-18: qty 10

## 2024-05-18 NOTE — H&P (Signed)
 Outpatient short stay form Pre-procedure 05/18/2024 9:00 AM Lucillie Kiesel K. Aundria, M.D.  Primary Physician: Rock Pounds, M.D.  Reason for visit:  Epigastric pain, Nausea  History of present illness: 68 year old male hide presents for epigastric pain intermittently for about 1 month.  Patient has been seen and [facility] emergency room and diagnosed with possible diverticulitis.  He was given antibiotics but this did not improve his pain much.  The pain is in the mid to left upper quadrant and feels sore.  He has mild nausea without vomiting.  No hematemesis.  He takes an aspirin daily.    Current Facility-Administered Medications:    0.9 %  sodium chloride  infusion, , Intravenous, Continuous, [place], Damarian Priola K, MD, Last Rate: 20 mL/hr at 05/18/24 0819, New Bag at 05/18/24 0819  Medications Prior to Admission  Medication Sig Dispense Refill Last Dose/Taking   amLODipine (NORVASC) 10 MG tablet Take 10 mg by mouth daily.      atorvastatin (LIPITOR) 20 MG tablet Take 20 mg by mouth daily.      ciprofloxacin -dexamethasone  (CIPRODEX ) OTIC suspension Place 4 drops into both ears 2 (two) times daily for 5 days. (DOS 11/19/2023) 7.5 mL 0    Dutasteride-Tamsulosin HCl 0.5-0.4 MG CAPS       finasteride (PROSCAR) 5 MG tablet Take 5 mg by mouth daily.      FLUoxetine (PROZAC) 20 MG tablet Take 20 mg by mouth daily.      gemfibrozil (LOPID) 600 MG tablet Take 600 mg by mouth daily. (Patient not taking: Reported on 03/16/2023)      glipiZIDE (GLUCOTROL) 5 MG tablet Take 5 mg by mouth 2 (two) times daily before a meal.      hydrALAZINE (APRESOLINE) 25 MG tablet Take 25 mg by mouth 2 (two) times daily.      hydrochlorothiazide (HYDRODIURIL) 25 MG tablet Take 25 mg by mouth daily.      HYDROcodone -acetaminophen  (NORCO/VICODIN) 5-325 MG tablet Take 1-2 tablets by mouth every 6 (six) hours as needed for moderate pain. (Patient not taking: Reported on 03/16/2023) 40 tablet 0    linagliptin (TRADJENTA) 5 MG TABS  tablet Take 5 mg by mouth daily.      lisinopril (ZESTRIL) 40 MG tablet Take 40 mg by mouth daily.      magnesium oxide (MAG-OX) 400 (240 Mg) MG tablet Take 1 tablet by mouth 2 (two) times daily.      metFORMIN (GLUCOPHAGE) 500 MG tablet Take 500 mg by mouth 2 (two) times daily with a meal.      quinapril (ACCUPRIL) 20 MG tablet Take 20 mg by mouth daily. (Patient not taking: Reported on 03/16/2023)      tiZANidine (ZANAFLEX) 4 MG capsule Take 4 mg by mouth 3 (three) times daily.        No Known Allergies   Past Medical History:  Diagnosis Date   Diabetes mellitus without complication (HCC)    Pt takes Metformin.   Hypertension     Review of systems:  Otherwise negative.    Physical Exam  Gen: Alert, oriented. Appears stated age.  HEENT: [place]/AT. PERRLA. Lungs: CTA, no wheezes. CV: RR nl S1, S2. Abd: soft, benign, no masses. BS+ Ext: No edema. Pulses 2+    Planned procedures: Proceed with EGD. The patient understands the nature of the planned procedure, indications, risks, alternatives and potential complications including but not limited to bleeding, infection, perforation, damage to internal organs and possible oversedation/side effects from anesthesia. The patient agrees and gives consent  to proceed.  Please refer to procedure notes for findings, recommendations and patient disposition/instructions.     Chatham Howington K. Aundria, M.D. Gastroenterology 05/18/2024  9:00 AM

## 2024-05-18 NOTE — Interval H&P Note (Signed)
 History and Physical Interval Note:  05/18/2024 9:03 AM  Joe Hart  has presented today for surgery, with the diagnosis of Epigastric pain [R10.13] Bloating [R14.0].  The various methods of treatment have been discussed with the patient and family. After consideration of risks, benefits and other options for treatment, the patient has consented to  Procedure(s) with comments: EGD (ESOPHAGOGASTRODUODENOSCOPY) (N/A) - DM as a surgical intervention.  The patient's history has been reviewed, patient examined, no change in status, stable for surgery.  I have reviewed the patient's chart and labs.  Questions were answered to the patient's satisfaction.     [place], [name]

## 2024-05-18 NOTE — Op Note (Signed)
 [facility] Patient Name: Joe Hart Procedure Date: 05/18/2024 8:06 AM MRN: [id number] Account #: [id number] Date of Birth: Jul 06, 1956 Admit Type: Outpatient Age: 68 Room: [facility] ENDO ROOM 2 Gender: Male Note Status: Finalized Instrument Name: Upper GI Scope [id number] Procedure:             Upper GI endoscopy Indications:           Epigastric abdominal pain, Abdominal bloating Providers:             Shaquandra Galano K. Aundria MD, MD Referring MD:          Buren.Linda Medicines:             Propofol  per Anesthesia Complications:         No immediate complications. Estimated blood loss:                         Minimal. Procedure:             Pre-Anesthesia Assessment:                        - The risks and benefits of the procedure and the                         sedation options and risks were discussed with the                         patient. All questions were answered and informed                         consent was obtained.                        - Patient identification and proposed procedure were                         verified prior to the procedure by the nurse. The                         procedure was verified in the procedure room.                        - ASA Grade Assessment: III - A patient with severe                         systemic disease.                        - After reviewing the risks and benefits, the patient                         was deemed in satisfactory condition to undergo the                         procedure.                        After obtaining informed consent, the endoscope was                         passed under direct vision.  Throughout the procedure,                         the patient's blood pressure, pulse, and oxygen                         saturations were monitored continuously. The Endoscope                         was introduced through the mouth, and advanced to the                         third part of  duodenum. The upper GI endoscopy was                         accomplished without difficulty. The patient tolerated                         the procedure well. Findings:      The esophagus was normal.      Patchy mildly erythematous mucosa without bleeding was found in the       gastric body and in the gastric antrum. Biopsies were taken with a cold       forceps for Helicobacter pylori testing. Estimated blood loss was       minimal.      The cardia and gastric fundus were normal on retroflexion.      The examined duodenum was normal. Impression:            - Normal esophagus.                        - Erythematous mucosa in the gastric body and antrum.                         Biopsied.                        - Normal examined duodenum. Recommendation:        - Patient has a contact number available for                         emergencies. The signs and symptoms of potential                         delayed complications were discussed with the patient.                         Return to normal activities tomorrow. Written                         discharge instructions were provided to the patient.                        - Resume previous diet.                        - Continue present medications.                        - Await pathology results.                        -  Follow up with Romero Antigua, PA-C at [facility]. [phone].                        - Telephone GI office to schedule appointment at the                         next available appointment.                        - The findings and recommendations were discussed with                         the patient. Procedure Code(s):     --- Professional ---                        [phone], Esophagogastroduodenoscopy, flexible,                         transoral; with biopsy, single or multiple Diagnosis Code(s):     --- Professional ---                        R14.0, Abdominal  distension (gaseous)                        R10.13, Epigastric pain                        K31.89, Other diseases of stomach and duodenum CPT copyright 2022 American Medical Association. All rights reserved. The codes documented in this report are preliminary and upon coder review may  be revised to meet current compliance requirements. Ladell MARLA Boss MD, MD 05/18/2024 9:14:24 AM This report has been signed electronically. Number of Addenda: 0 Note Initiated On: 05/18/2024 8:06 AM Estimated Blood Loss:  Estimated blood loss was minimal.      [facility]

## 2024-05-18 NOTE — Anesthesia Postprocedure Evaluation (Signed)
 Anesthesia Post Note  Patient: Joe Hart  Procedure(s) Performed: EGD (ESOPHAGOGASTRODUODENOSCOPY)  Patient location during evaluation: PACU Anesthesia Type: General Level of consciousness: awake Pain management: satisfactory to patient Vital Signs Assessment: post-procedure vital signs reviewed and stable Respiratory status: spontaneous breathing Cardiovascular status: stable Anesthetic complications: no   No notable events documented.   Last Vitals:  Vitals:   05/18/24 0927 05/18/24 0937  BP: 107/73 110/78  Pulse: 77   Resp: 14 20  Temp:    SpO2: 92% 96%    Last Pain:  Vitals:   05/18/24 0937  TempSrc:   PainSc: 0-No pain                 VAN STAVEREN,Humberto Addo

## 2024-05-18 NOTE — Anesthesia Preprocedure Evaluation (Signed)
 Anesthesia Evaluation  Patient identified by MRN, date of birth, ID band Patient awake    Reviewed: Allergy & Precautions, NPO status , Patient's Chart, lab work & pertinent test results  Airway Mallampati: II  TM Distance: >3 FB Neck ROM: full    Dental  (+) Upper Dentures, Lower Dentures   Pulmonary neg pulmonary ROS, COPD, Patient abstained from smoking., former smoker   Pulmonary exam normal  + decreased breath sounds      Cardiovascular Exercise Tolerance: Good hypertension, Pt. on medications negative cardio ROS Normal cardiovascular exam Rhythm:Regular Rate:Normal     Neuro/Psych negative neurological ROS  negative psych ROS   GI/Hepatic negative GI ROS, Neg liver ROS,,,  Endo/Other  negative endocrine ROSdiabetes, Type 2, Oral Hypoglycemic Agents    Renal/GU negative Renal ROS  negative genitourinary   Musculoskeletal   Abdominal Normal abdominal exam  (+)   Peds negative pediatric ROS (+)  Hematology negative hematology ROS (+)   Anesthesia Other Findings Past Medical History: No date: Diabetes mellitus without complication (HCC)     Comment:  Pt takes Metformin. No date: Hypertension  Past Surgical History: 03/23/2023: COLONOSCOPY WITH PROPOFOL ; N/A     Comment:  Procedure: COLONOSCOPY WITH PROPOFOL ;  Surgeon: Jinny Carmine, MD;  Location: [facility];  Service:               Endoscopy;  Laterality: N/A; 04/22/2016: LIPOMA EXCISION; Right     Comment:  Procedure: EXCISION LIPOMA NECK;  Surgeon: Deward Dolly,              MD;  Location: [facility];  Service: ENT;                Laterality: Right; 11/19/2023: MYRINGOTOMY WITH TUBE PLACEMENT; Bilateral     Comment:  Procedure: MYRINGOTOMY WITH TUBE PLACEMENT;  Surgeon:               Rumalda Massie RAMAN, MD;  Location: [facility];                Service: ENT;  Laterality: Bilateral;  BUTTERFLY TUBES 03/23/2023: POLYPECTOMY      Comment:  Procedure: POLYPECTOMY;  Surgeon: Jinny Carmine, MD;                Location: [facility];  Service: Endoscopy;; No date: UVULOPALATOPHARYNGOPLASTY  BMI    Body Mass Index: 26.15 kg/m      Reproductive/Obstetrics negative OB ROS                              Anesthesia Physical Anesthesia Plan  ASA: 3  Anesthesia Plan: General   Post-op Pain Management:    Induction: Intravenous  PONV Risk Score and Plan: Propofol  infusion and TIVA  Airway Management Planned: Natural Airway and Nasal Cannula  Additional Equipment:   Intra-op Plan:   Post-operative Plan:   Informed Consent: I have reviewed the patients History and Physical, chart, labs and discussed the procedure including the risks, benefits and alternatives for the proposed anesthesia with the patient or authorized representative who has indicated his/her understanding and acceptance.     Dental Advisory Given  Plan Discussed with: CRNA  Anesthesia Plan Comments:         Anesthesia Quick Evaluation

## 2024-05-18 NOTE — Transfer of Care (Signed)
 Immediate Anesthesia Transfer of Care Note  Patient: Joe Hart  Procedure(s) Performed: EGD (ESOPHAGOGASTRODUODENOSCOPY)  Patient Location: PACU  Anesthesia Type:General  Level of Consciousness: sedated  Airway & Oxygen Therapy: Patient Spontanous Breathing  Post-op Assessment: Report given to RN and Post -op Vital signs reviewed and stable  Post vital signs: Reviewed and stable  Last Vitals:  Vitals Value Taken Time  BP    Temp    Pulse    Resp    SpO2      Last Pain:  Vitals:   05/18/24 0810  TempSrc: Tympanic  PainSc: 0-No pain         Complications: No notable events documented.

## 2024-05-20 LAB — SURGICAL PATHOLOGY
